# Patient Record
Sex: Female | Born: 1970
Health system: Southern US, Community
[De-identification: ages and names within clinical notes are randomized; demographics above are authoritative.]

## PROBLEM LIST (undated history)

## (undated) DIAGNOSIS — I10 Essential (primary) hypertension: Secondary | ICD-10-CM

## (undated) DIAGNOSIS — Z973 Presence of spectacles and contact lenses: Secondary | ICD-10-CM

## (undated) DIAGNOSIS — F419 Anxiety disorder, unspecified: Secondary | ICD-10-CM

## (undated) DIAGNOSIS — K219 Gastro-esophageal reflux disease without esophagitis: Secondary | ICD-10-CM

## (undated) DIAGNOSIS — N3010 Interstitial cystitis (chronic) without hematuria: Secondary | ICD-10-CM

## (undated) DIAGNOSIS — M1990 Unspecified osteoarthritis, unspecified site: Secondary | ICD-10-CM

## (undated) DIAGNOSIS — F329 Major depressive disorder, single episode, unspecified: Secondary | ICD-10-CM

## (undated) DIAGNOSIS — M51369 Other intervertebral disc degeneration, lumbar region without mention of lumbar back pain or lower extremity pain: Secondary | ICD-10-CM

## (undated) DIAGNOSIS — M5126 Other intervertebral disc displacement, lumbar region: Secondary | ICD-10-CM

## (undated) DIAGNOSIS — Z86718 Personal history of other venous thrombosis and embolism: Secondary | ICD-10-CM

## (undated) DIAGNOSIS — F32A Depression, unspecified: Secondary | ICD-10-CM

## (undated) DIAGNOSIS — M5136 Other intervertebral disc degeneration, lumbar region: Secondary | ICD-10-CM

## (undated) DIAGNOSIS — R3989 Other symptoms and signs involving the genitourinary system: Secondary | ICD-10-CM

## (undated) HISTORY — DX: Essential (primary) hypertension: I10

## (undated) HISTORY — DX: Depression, unspecified: F32.A

## (undated) HISTORY — PX: LUMBAR EPIDURAL INJECTION: SHX1980

## (undated) HISTORY — DX: Unspecified osteoarthritis, unspecified site: M19.90

## (undated) HISTORY — DX: Major depressive disorder, single episode, unspecified: F32.9

## (undated) HISTORY — DX: Interstitial cystitis (chronic) without hematuria: N30.10

## (undated) HISTORY — PX: CARPAL TUNNEL RELEASE: SHX101

## (undated) HISTORY — PX: OTHER SURGICAL HISTORY: SHX169

## (undated) HISTORY — DX: Anxiety disorder, unspecified: F41.9

---

## 1997-11-26 ENCOUNTER — Other Ambulatory Visit: Admission: RE | Admit: 1997-11-26 | Discharge: 1997-11-26 | Payer: Self-pay | Admitting: Obstetrics and Gynecology

## 1997-12-23 ENCOUNTER — Encounter: Admission: RE | Admit: 1997-12-23 | Discharge: 1998-03-23 | Payer: Self-pay | Admitting: Gynecology

## 1998-04-27 ENCOUNTER — Encounter: Admission: RE | Admit: 1998-04-27 | Discharge: 1998-07-26 | Payer: Self-pay | Admitting: Gynecology

## 1998-05-07 ENCOUNTER — Encounter: Payer: Self-pay | Admitting: Emergency Medicine

## 1998-05-08 ENCOUNTER — Inpatient Hospital Stay (HOSPITAL_COMMUNITY): Admission: AD | Admit: 1998-05-08 | Discharge: 1998-05-09 | Payer: Self-pay | Admitting: Obstetrics and Gynecology

## 1998-05-08 HISTORY — PX: LAPAROSCOPIC CHOLECYSTECTOMY: SUR755

## 1998-06-08 HISTORY — PX: TUBAL LIGATION: SHX77

## 1998-06-19 ENCOUNTER — Inpatient Hospital Stay (HOSPITAL_COMMUNITY): Admission: AD | Admit: 1998-06-19 | Discharge: 1998-06-21 | Payer: Self-pay | Admitting: *Deleted

## 1998-07-29 ENCOUNTER — Other Ambulatory Visit: Admission: RE | Admit: 1998-07-29 | Discharge: 1998-07-29 | Payer: Self-pay | Admitting: Obstetrics and Gynecology

## 1998-11-30 ENCOUNTER — Ambulatory Visit (HOSPITAL_COMMUNITY): Admission: RE | Admit: 1998-11-30 | Discharge: 1998-12-01 | Payer: Self-pay | Admitting: General Surgery

## 1998-11-30 ENCOUNTER — Encounter (HOSPITAL_BASED_OUTPATIENT_CLINIC_OR_DEPARTMENT_OTHER): Payer: Self-pay | Admitting: General Surgery

## 2000-01-02 ENCOUNTER — Emergency Department (HOSPITAL_COMMUNITY): Admission: EM | Admit: 2000-01-02 | Discharge: 2000-01-02 | Payer: Self-pay | Admitting: Emergency Medicine

## 2000-01-05 ENCOUNTER — Emergency Department (HOSPITAL_COMMUNITY): Admission: EM | Admit: 2000-01-05 | Discharge: 2000-01-05 | Payer: Self-pay | Admitting: Emergency Medicine

## 2002-07-28 ENCOUNTER — Encounter: Payer: Self-pay | Admitting: *Deleted

## 2002-07-28 ENCOUNTER — Emergency Department (HOSPITAL_COMMUNITY): Admission: EM | Admit: 2002-07-28 | Discharge: 2002-07-28 | Payer: Self-pay | Admitting: *Deleted

## 2003-06-25 ENCOUNTER — Encounter: Admission: RE | Admit: 2003-06-25 | Discharge: 2003-06-25 | Payer: Self-pay | Admitting: Emergency Medicine

## 2005-03-19 ENCOUNTER — Emergency Department (HOSPITAL_COMMUNITY): Admission: EM | Admit: 2005-03-19 | Discharge: 2005-03-19 | Payer: Self-pay | Admitting: Emergency Medicine

## 2007-05-06 ENCOUNTER — Ambulatory Visit (HOSPITAL_COMMUNITY): Admission: RE | Admit: 2007-05-06 | Discharge: 2007-05-06 | Payer: Self-pay | Admitting: Emergency Medicine

## 2009-11-16 ENCOUNTER — Ambulatory Visit: Payer: Self-pay | Admitting: Gynecology

## 2009-11-16 ENCOUNTER — Other Ambulatory Visit: Admission: RE | Admit: 2009-11-16 | Discharge: 2009-11-16 | Payer: Self-pay | Admitting: Gynecology

## 2009-11-26 ENCOUNTER — Ambulatory Visit: Payer: Self-pay | Admitting: Gynecology

## 2009-12-09 ENCOUNTER — Ambulatory Visit: Payer: Self-pay | Admitting: Gynecology

## 2009-12-20 ENCOUNTER — Ambulatory Visit (HOSPITAL_BASED_OUTPATIENT_CLINIC_OR_DEPARTMENT_OTHER): Admission: RE | Admit: 2009-12-20 | Discharge: 2009-12-20 | Payer: Self-pay | Admitting: Gynecology

## 2009-12-20 ENCOUNTER — Ambulatory Visit: Payer: Self-pay | Admitting: Gynecology

## 2009-12-20 HISTORY — PX: OTHER SURGICAL HISTORY: SHX169

## 2010-01-03 ENCOUNTER — Ambulatory Visit: Payer: Self-pay | Admitting: Gynecology

## 2010-01-11 ENCOUNTER — Ambulatory Visit (HOSPITAL_BASED_OUTPATIENT_CLINIC_OR_DEPARTMENT_OTHER): Admission: RE | Admit: 2010-01-11 | Discharge: 2010-01-11 | Payer: Self-pay | Admitting: Urology

## 2010-01-28 ENCOUNTER — Inpatient Hospital Stay (HOSPITAL_COMMUNITY): Admission: EM | Admit: 2010-01-28 | Discharge: 2010-01-30 | Payer: Self-pay | Admitting: Emergency Medicine

## 2010-01-28 ENCOUNTER — Encounter: Payer: Self-pay | Admitting: Emergency Medicine

## 2010-04-12 ENCOUNTER — Ambulatory Visit: Payer: Self-pay | Admitting: Gynecology

## 2010-04-18 ENCOUNTER — Ambulatory Visit: Payer: Self-pay | Admitting: Gynecology

## 2010-04-18 ENCOUNTER — Ambulatory Visit
Admission: RE | Admit: 2010-04-18 | Discharge: 2010-04-19 | Payer: Self-pay | Source: Home / Self Care | Attending: Gynecology | Admitting: Gynecology

## 2010-04-18 HISTORY — PX: VAGINAL HYSTERECTOMY: SUR661

## 2010-04-28 ENCOUNTER — Ambulatory Visit: Payer: Self-pay | Admitting: Gynecology

## 2010-05-12 ENCOUNTER — Ambulatory Visit
Admission: RE | Admit: 2010-05-12 | Discharge: 2010-05-12 | Payer: Self-pay | Source: Home / Self Care | Attending: Gynecology | Admitting: Gynecology

## 2010-05-23 ENCOUNTER — Emergency Department (HOSPITAL_COMMUNITY)
Admission: EM | Admit: 2010-05-23 | Discharge: 2010-05-23 | Payer: Self-pay | Source: Home / Self Care | Admitting: Emergency Medicine

## 2010-05-25 LAB — DIFFERENTIAL
Basophils Absolute: 0 10*3/uL (ref 0.0–0.1)
Basophils Relative: 0 % (ref 0–1)
Eosinophils Absolute: 0.1 10*3/uL (ref 0.0–0.7)
Eosinophils Relative: 1 % (ref 0–5)
Lymphocytes Relative: 28 % (ref 12–46)
Lymphs Abs: 3.7 10*3/uL (ref 0.7–4.0)
Monocytes Absolute: 1.1 10*3/uL — ABNORMAL HIGH (ref 0.1–1.0)
Monocytes Relative: 8 % (ref 3–12)
Neutro Abs: 8.5 10*3/uL — ABNORMAL HIGH (ref 1.7–7.7)
Neutrophils Relative %: 63 % (ref 43–77)

## 2010-05-25 LAB — CBC
HCT: 48.7 % — ABNORMAL HIGH (ref 36.0–46.0)
Hemoglobin: 16.5 g/dL — ABNORMAL HIGH (ref 12.0–15.0)
MCH: 31.2 pg (ref 26.0–34.0)
MCHC: 33.9 g/dL (ref 30.0–36.0)
MCV: 92.1 fL (ref 78.0–100.0)
Platelets: 297 10*3/uL (ref 150–400)
RBC: 5.29 MIL/uL — ABNORMAL HIGH (ref 3.87–5.11)
RDW: 13.7 % (ref 11.5–15.5)
WBC: 13.4 10*3/uL — ABNORMAL HIGH (ref 4.0–10.5)

## 2010-05-25 LAB — BASIC METABOLIC PANEL
BUN: 17 mg/dL (ref 6–23)
CO2: 29 mEq/L (ref 19–32)
Calcium: 10.5 mg/dL (ref 8.4–10.5)
Chloride: 98 mEq/L (ref 96–112)
Creatinine, Ser: 0.77 mg/dL (ref 0.4–1.2)
GFR calc Af Amer: >60 mL/min (ref >60)
GFR calc non Af Amer: >60 mL/min (ref >60)
Glucose, Bld: 97 mg/dL (ref 70–99)
Potassium: 4.2 mEq/L (ref 3.5–5.1)
Sodium: 135 mEq/L (ref 135–145)

## 2010-05-25 LAB — RAPID URINE DRUG SCREEN, HOSP PERFORMED
Amphetamines: NOT DETECTED
Barbiturates: NOT DETECTED
Benzodiazepines: NOT DETECTED
Cocaine: NOT DETECTED
Opiates: POSITIVE — AB
Tetrahydrocannabinol: NOT DETECTED

## 2010-05-25 LAB — TRICYCLICS SCREEN, URINE: TCA Scrn: POSITIVE — AB

## 2010-05-25 LAB — ETHANOL: Alcohol, Ethyl (B): <5 mg/dL (ref 0–10)

## 2010-05-26 ENCOUNTER — Ambulatory Visit
Admission: RE | Admit: 2010-05-26 | Discharge: 2010-05-26 | Payer: Self-pay | Source: Home / Self Care | Attending: Gynecology | Admitting: Gynecology

## 2010-06-08 ENCOUNTER — Other Ambulatory Visit: Payer: Self-pay | Admitting: Gastroenterology

## 2010-06-15 ENCOUNTER — Ambulatory Visit
Admission: RE | Admit: 2010-06-15 | Discharge: 2010-06-15 | Disposition: A | Discharge status: Home / Self Care | Payer: BC Managed Care – PPO | Source: Ambulatory Visit | Attending: Gastroenterology | Admitting: Gastroenterology

## 2010-06-30 ENCOUNTER — Ambulatory Visit
Admission: RE | Admit: 2010-06-30 | Discharge: 2010-06-30 | Disposition: A | Discharge status: Home / Self Care | Payer: BC Managed Care – PPO | Source: Ambulatory Visit | Attending: Gastroenterology | Admitting: Gastroenterology

## 2010-07-18 LAB — DIFFERENTIAL
Basophils Absolute: 0 10*3/uL (ref 0.0–0.1)
Basophils Relative: 0 % (ref 0–1)
Eosinophils Absolute: 0.1 10*3/uL (ref 0.0–0.7)
Eosinophils Relative: 0 % (ref 0–5)
Lymphocytes Relative: 14 % (ref 12–46)
Lymphs Abs: 1.5 10*3/uL (ref 0.7–4.0)
Monocytes Absolute: 0.9 10*3/uL (ref 0.1–1.0)
Monocytes Relative: 8 % (ref 3–12)
Neutro Abs: 8.9 10*3/uL — ABNORMAL HIGH (ref 1.7–7.7)
Neutrophils Relative %: 78 % — ABNORMAL HIGH (ref 43–77)

## 2010-07-18 LAB — CBC
HCT: 36 % (ref 36.0–46.0)
Hemoglobin: 11.8 g/dL — ABNORMAL LOW (ref 12.0–15.0)
MCH: 31.1 pg (ref 26.0–34.0)
MCHC: 32.8 g/dL (ref 30.0–36.0)
MCV: 95 fL (ref 78.0–100.0)
Platelets: 152 10*3/uL (ref 150–400)
RBC: 3.79 MIL/uL — ABNORMAL LOW (ref 3.87–5.11)
RDW: 13.4 % (ref 11.5–15.5)
WBC: 11.3 10*3/uL — ABNORMAL HIGH (ref 4.0–10.5)

## 2010-07-21 LAB — DIFFERENTIAL
Basophils Absolute: 0 10*3/uL (ref 0.0–0.1)
Basophils Absolute: 0 10*3/uL (ref 0.0–0.1)
Basophils Relative: 0 % (ref 0–1)
Basophils Relative: 0 % (ref 0–1)
Eosinophils Absolute: 0.1 10*3/uL (ref 0.0–0.7)
Eosinophils Relative: 1 % (ref 0–5)
Lymphocytes Relative: 23 % (ref 12–46)
Lymphs Abs: 2 10*3/uL (ref 0.7–4.0)
Monocytes Absolute: 0.5 10*3/uL (ref 0.1–1.0)
Monocytes Absolute: 0.4 10*3/uL (ref 0.1–1.0)
Monocytes Relative: 6 % (ref 3–12)
Neutro Abs: 6.1 10*3/uL (ref 1.7–7.7)
Neutro Abs: 4.2 10*3/uL (ref 1.7–7.7)
Neutrophils Relative %: 70 % (ref 43–77)

## 2010-07-21 LAB — BASIC METABOLIC PANEL
BUN: 15 mg/dL (ref 6–23)
BUN: 13 mg/dL (ref 6–23)
BUN: 15 mg/dL (ref 6–23)
CO2: 26 mEq/L (ref 19–32)
CO2: 31 mEq/L (ref 19–32)
Calcium: 9.2 mg/dL (ref 8.4–10.5)
Calcium: 8.6 mg/dL (ref 8.4–10.5)
Calcium: 9.2 mg/dL (ref 8.4–10.5)
Chloride: 101 mEq/L (ref 96–112)
Chloride: 102 mEq/L (ref 96–112)
Creatinine, Ser: 0.67 mg/dL (ref 0.4–1.2)
Creatinine, Ser: 0.75 mg/dL (ref 0.4–1.2)
GFR calc Af Amer: >60 mL/min (ref >60)
GFR calc Af Amer: >60 mL/min (ref >60)
GFR calc non Af Amer: >60 mL/min (ref >60)
GFR calc non Af Amer: >60 mL/min (ref >60)
GFR calc non Af Amer: >60 mL/min (ref >60)
Glucose, Bld: 99 mg/dL (ref 70–99)
Glucose, Bld: 102 mg/dL — ABNORMAL HIGH (ref 70–99)
Glucose, Bld: 101 mg/dL — ABNORMAL HIGH (ref 70–99)
Potassium: 4 mEq/L (ref 3.5–5.1)
Potassium: 3.6 mEq/L (ref 3.5–5.1)
Sodium: 134 mEq/L — ABNORMAL LOW (ref 135–145)
Sodium: 136 mEq/L (ref 135–145)

## 2010-07-21 LAB — CBC
HCT: 41.3 % (ref 36.0–46.0)
HCT: 36.4 % (ref 36.0–46.0)
HCT: 39.5 % (ref 36.0–46.0)
Hemoglobin: 14.2 g/dL (ref 12.0–15.0)
Hemoglobin: 12.3 g/dL (ref 12.0–15.0)
MCH: 31.8 pg (ref 26.0–34.0)
MCH: 30.9 pg (ref 26.0–34.0)
MCHC: 34.4 g/dL (ref 30.0–36.0)
MCHC: 33.8 g/dL (ref 30.0–36.0)
MCHC: 34.3 g/dL (ref 30.0–36.0)
MCV: 92.5 fL (ref 78.0–100.0)
MCV: 91.5 fL (ref 78.0–100.0)
Platelets: 219 10*3/uL (ref 150–400)
Platelets: 198 10*3/uL (ref 150–400)
RBC: 4.47 MIL/uL (ref 3.87–5.11)
RBC: 3.98 MIL/uL (ref 3.87–5.11)
RDW: 14.3 % (ref 11.5–15.5)
RDW: 14.3 % (ref 11.5–15.5)
RDW: 14.7 % (ref 11.5–15.5)
WBC: 8.7 10*3/uL (ref 4.0–10.5)
WBC: 8 10*3/uL (ref 4.0–10.5)

## 2010-07-21 LAB — CK: Total CK: 25 U/L (ref 7–177)

## 2010-07-21 LAB — URINALYSIS, ROUTINE W REFLEX MICROSCOPIC
Bilirubin Urine: NEGATIVE
Glucose, UA: NEGATIVE mg/dL
Hgb urine dipstick: NEGATIVE
Ketones, ur: 15 mg/dL — AB
Nitrite: NEGATIVE
Protein, ur: NEGATIVE mg/dL
Specific Gravity, Urine: 1.022 (ref 1.005–1.030)
Urobilinogen, UA: 0.2 mg/dL (ref 0.0–1.0)
pH: 5.5 (ref 5.0–8.0)

## 2010-07-21 LAB — URINE MICROSCOPIC-ADD ON

## 2010-07-22 LAB — POCT I-STAT, CHEM 8
Calcium, Ion: 1.27 mmol/L (ref 1.12–1.32)
Chloride: 101 mEq/L (ref 96–112)
HCT: 44 % (ref 36.0–46.0)
Hemoglobin: 15 g/dL (ref 12.0–15.0)
TCO2: 30 mmol/L (ref 0–100)

## 2010-11-21 ENCOUNTER — Other Ambulatory Visit: Payer: Self-pay | Admitting: Gynecology

## 2010-11-21 ENCOUNTER — Encounter (INDEPENDENT_AMBULATORY_CARE_PROVIDER_SITE_OTHER): Payer: BC Managed Care – PPO | Admitting: Gynecology

## 2010-11-21 ENCOUNTER — Other Ambulatory Visit (HOSPITAL_COMMUNITY)
Admission: RE | Admit: 2010-11-21 | Discharge: 2010-11-21 | Disposition: A | Discharge status: Home / Self Care | Payer: BC Managed Care – PPO | Source: Ambulatory Visit | Attending: Gynecology | Admitting: Gynecology

## 2010-11-21 DIAGNOSIS — R823 Hemoglobinuria: Secondary | ICD-10-CM

## 2010-11-21 DIAGNOSIS — Z124 Encounter for screening for malignant neoplasm of cervix: Secondary | ICD-10-CM | POA: Insufficient documentation

## 2010-11-21 DIAGNOSIS — Z01419 Encounter for gynecological examination (general) (routine) without abnormal findings: Secondary | ICD-10-CM

## 2012-06-11 IMAGING — RF DG UGI W/ HIGH DENSITY W/KUB
18 of 20 series · 18 of 20 positions shown · non-contrast
Comparison: Upper GI 06/25/2003

CLINICAL DATA: Abdominal pain

UPPER GI SERIES WITH KUB
TECHNIQUE: Routine upper GI series was performed with with  high
density barium.
Fluoroscopy Time: 2.4 minutes

[Series 1: run · 1 of 1 slices shown (1 of 17)]
[im 1/1]
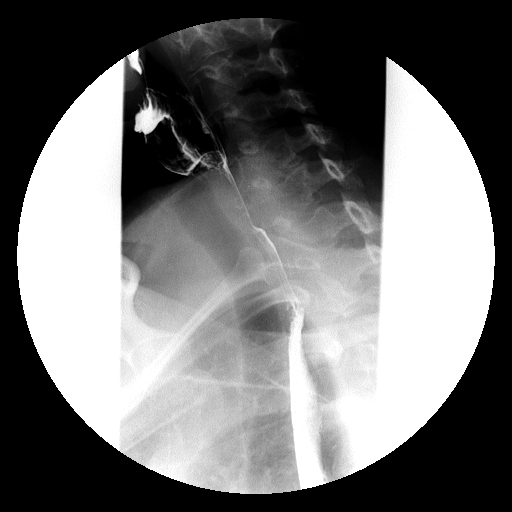

[Series 2: run · 1 of 1 slices shown (2 of 17)]
[im 1/1]
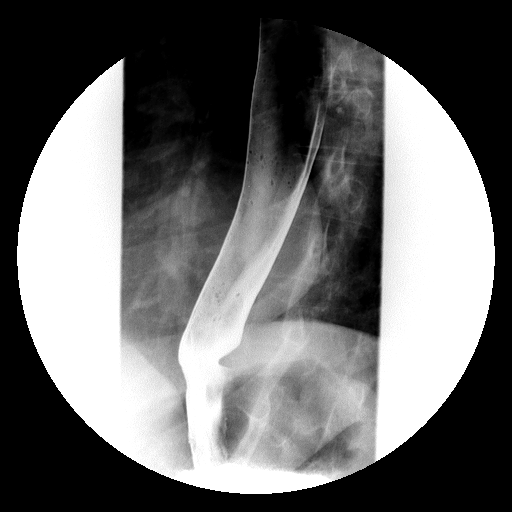

[Series 3: run · 1 of 1 slices shown (3 of 17)]
[im 1/1]
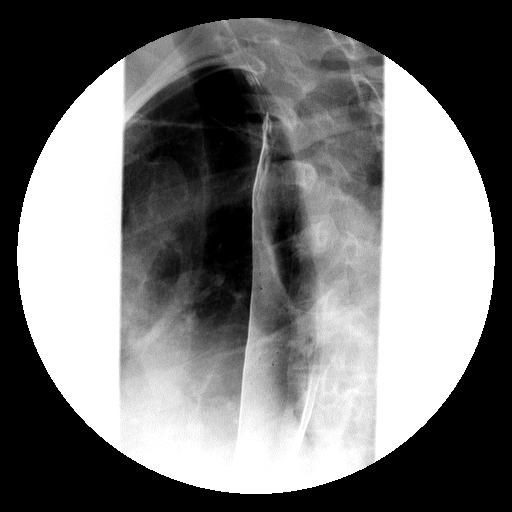

[Series 4: run · 1 of 1 slices shown (4 of 17)]
[im 1/1]
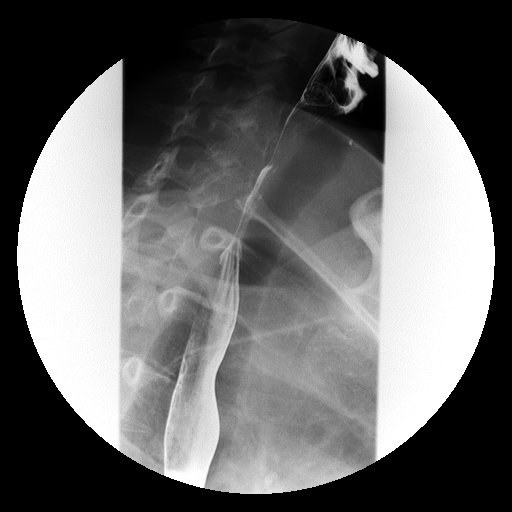

[Series 6: run · 1 of 1 slices shown (5 of 17)]
[im 1/1]
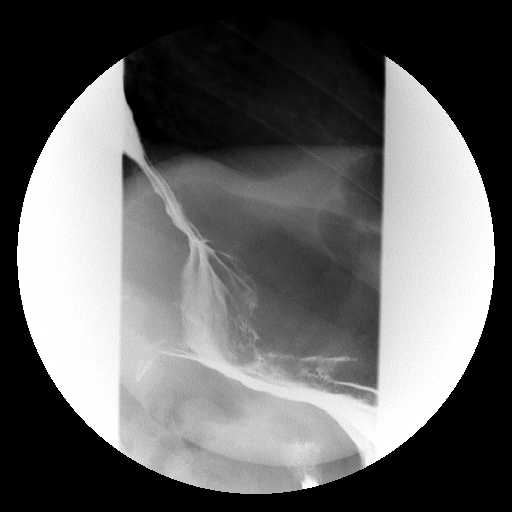

[Series 7: run · 1 of 1 slices shown (6 of 17)]
[im 1/1]
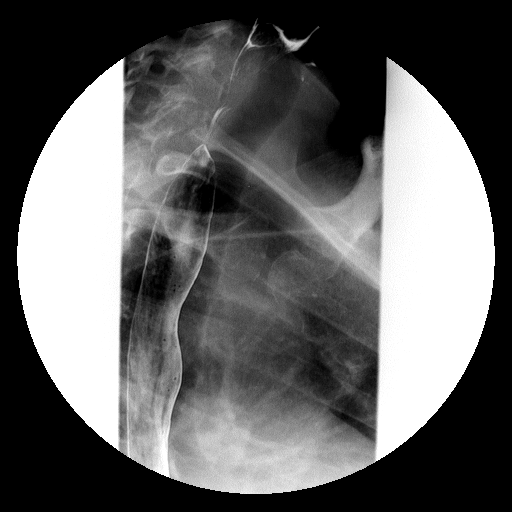

[Series 8: run · 1 of 1 slices shown (7 of 17)]
[im 1/1]
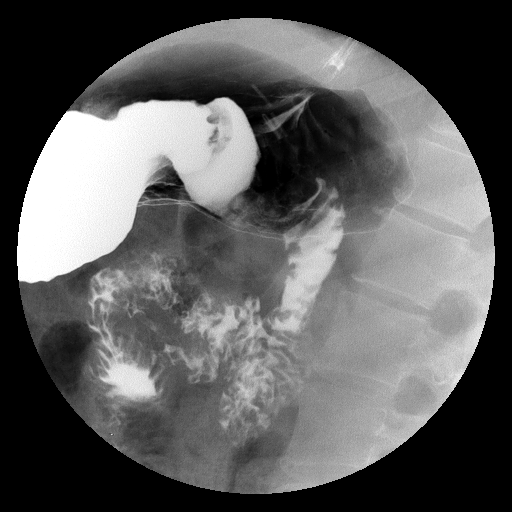

[Series 9: run · 1 of 1 slices shown (8 of 17)]
[im 1/1]
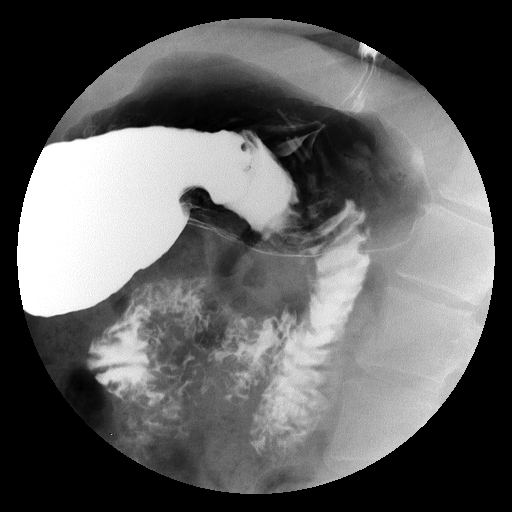

[Series 10: run · 1 of 1 slices shown (9 of 17)]
[im 1/1]
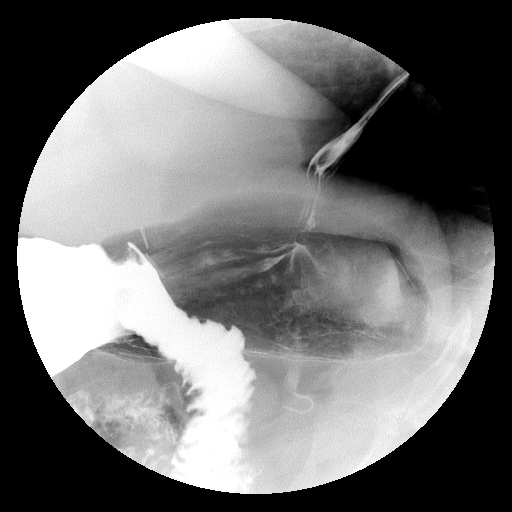

[Series 11: run · 1 of 1 slices shown (10 of 17)]
[im 1/1]
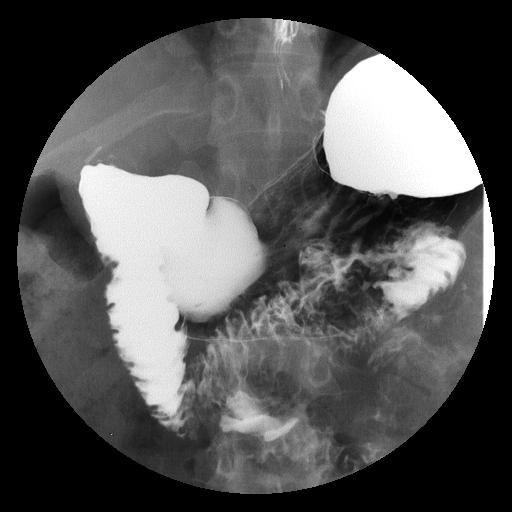

[Series 12: run · 1 of 1 slices shown (11 of 17)]
[im 1/1]
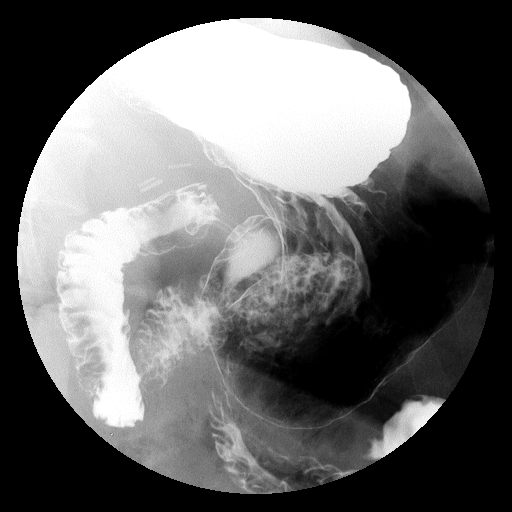

[Series 13: run · 1 of 1 slices shown (12 of 17)]
[im 1/1]
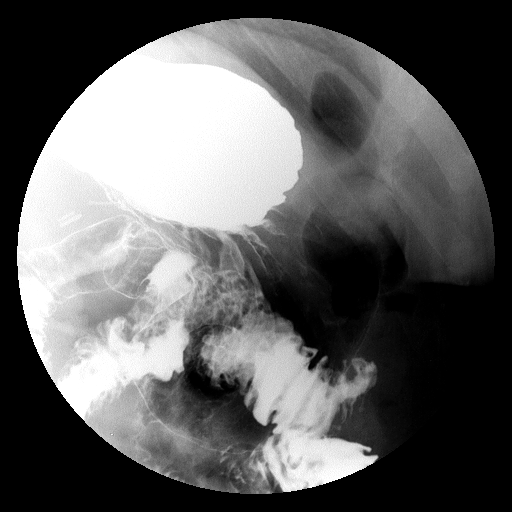

[Series 14: run · 1 of 1 slices shown (13 of 17)]
[im 1/1]
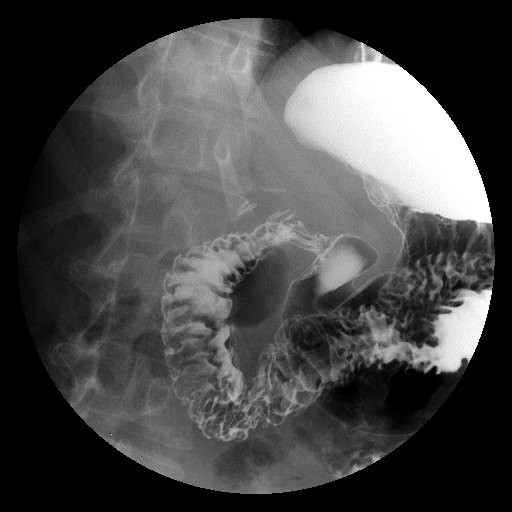

[Series 16: run · 1 of 1 slices shown (14 of 17)]
[im 1/1]
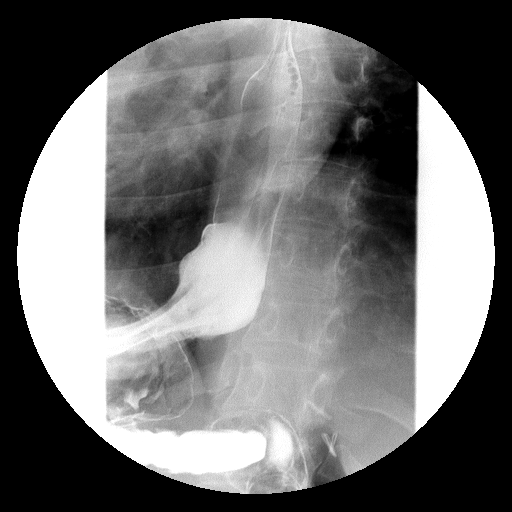

[Series 17: run · 1 of 1 slices shown (15 of 17)]
[im 1/1]
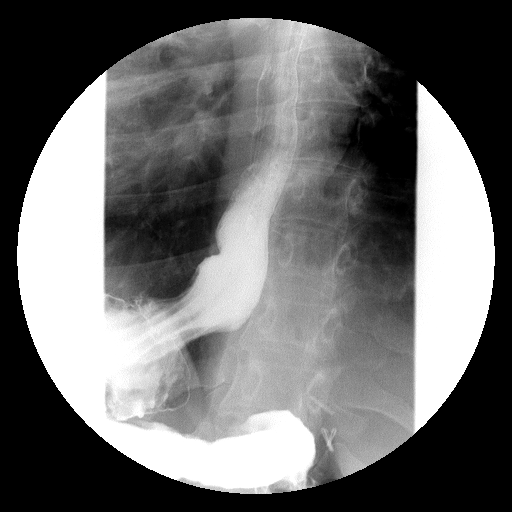

[Series 18: run · 1 of 1 slices shown (16 of 17)]
[im 1/1]
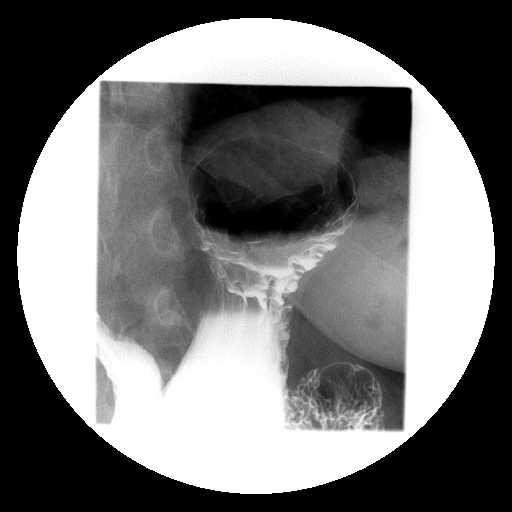

[Series 19: run · 1 of 1 slices shown (17 of 17)]
[im 1/1]
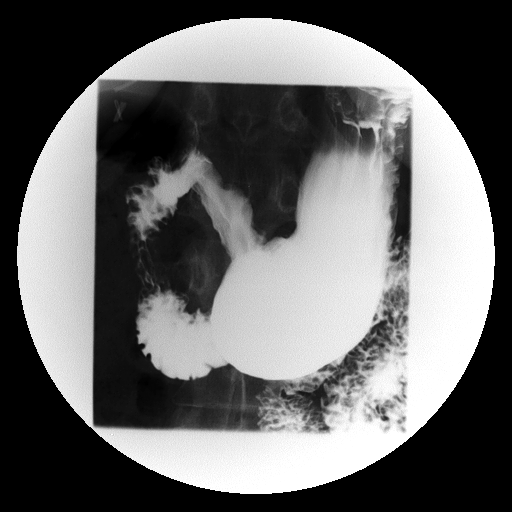

[Series 1001: view not recorded · 0.20mm/px · 1 of 1 slices shown]
[im 1/1]
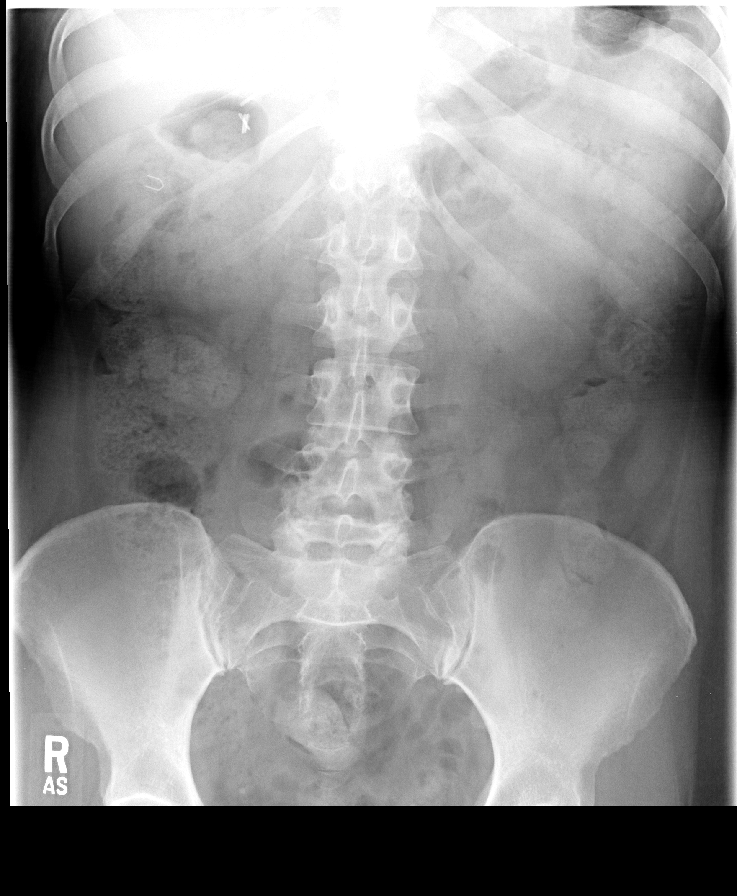

[18 of 20 positions shown; findings below may reference images not displayed]

FINDINGS: Initial KUB demonstrates a moderate volume stool
throughout the colon.  Cholecystectomy clips the upper quadrant.

There is no esophageal mucosal irregularity, stricture, or mass.  A
mild amount of gastroesophageal reflux was demonstrated during the
course of exam.  A 13 mm barium tablet passed GE junction easily.
Normal esophageal motility.

No mucosal irregularity, mass, obstruction of the stomach.  Normal
C-loop of the duodenum.  No evidence of duodenal mucosal
abnormality.
IMPRESSION: 1.  Mild gastroesophageal reflux demonstrated.
2.  No mucosal irregularity stricture or mass within the esophagus,
stomach, or duodenum.

## 2013-06-08 ENCOUNTER — Emergency Department (HOSPITAL_COMMUNITY): Payer: BC Managed Care – PPO

## 2013-06-08 ENCOUNTER — Emergency Department (HOSPITAL_COMMUNITY)
Admission: EM | Admit: 2013-06-08 | Discharge: 2013-06-08 | Disposition: A | Discharge status: Home / Self Care | Payer: BC Managed Care – PPO | Attending: Emergency Medicine | Admitting: Emergency Medicine

## 2013-06-08 ENCOUNTER — Encounter (HOSPITAL_COMMUNITY): Payer: Self-pay | Admitting: Emergency Medicine

## 2013-06-08 DIAGNOSIS — R0789 Other chest pain: Secondary | ICD-10-CM

## 2013-06-08 DIAGNOSIS — K219 Gastro-esophageal reflux disease without esophagitis: Secondary | ICD-10-CM | POA: Insufficient documentation

## 2013-06-08 DIAGNOSIS — Z87891 Personal history of nicotine dependence: Secondary | ICD-10-CM | POA: Insufficient documentation

## 2013-06-08 DIAGNOSIS — I10 Essential (primary) hypertension: Secondary | ICD-10-CM | POA: Insufficient documentation

## 2013-06-08 DIAGNOSIS — Z86718 Personal history of other venous thrombosis and embolism: Secondary | ICD-10-CM | POA: Insufficient documentation

## 2013-06-08 DIAGNOSIS — J45909 Unspecified asthma, uncomplicated: Secondary | ICD-10-CM | POA: Insufficient documentation

## 2013-06-08 DIAGNOSIS — Z880 Allergy status to penicillin: Secondary | ICD-10-CM | POA: Insufficient documentation

## 2013-06-08 DIAGNOSIS — F319 Bipolar disorder, unspecified: Secondary | ICD-10-CM | POA: Insufficient documentation

## 2013-06-08 DIAGNOSIS — N3010 Interstitial cystitis (chronic) without hematuria: Secondary | ICD-10-CM | POA: Insufficient documentation

## 2013-06-08 HISTORY — DX: Gastro-esophageal reflux disease without esophagitis: K21.9

## 2013-06-08 LAB — BASIC METABOLIC PANEL
BUN: 15 mg/dL (ref 6–23)
CALCIUM: 9.4 mg/dL (ref 8.4–10.5)
CO2: 24 meq/L (ref 19–32)
CREATININE: 0.7 mg/dL (ref 0.50–1.10)
Chloride: 98 mEq/L (ref 96–112)
GFR calc Af Amer: >90 mL/min (ref >90)
GLUCOSE: 95 mg/dL (ref 70–99)
Potassium: 4.1 mEq/L (ref 3.7–5.3)
Sodium: 136 mEq/L — ABNORMAL LOW (ref 137–147)

## 2013-06-08 LAB — CBC
HCT: 45.1 % (ref 36.0–46.0)
HEMOGLOBIN: 15.6 g/dL — AB (ref 12.0–15.0)
MCH: 30.6 pg (ref 26.0–34.0)
MCHC: 34.6 g/dL (ref 30.0–36.0)
MCV: 88.6 fL (ref 78.0–100.0)
Platelets: 203 10*3/uL (ref 150–400)
RBC: 5.09 MIL/uL (ref 3.87–5.11)
RDW: 12.7 % (ref 11.5–15.5)
WBC: 9.3 10*3/uL (ref 4.0–10.5)

## 2013-06-08 LAB — POCT I-STAT TROPONIN I: Troponin i, poc: 0 ng/mL (ref 0.00–0.08)

## 2013-06-08 NOTE — ED Notes (Signed)
Pt a+ox4, presents with c/o L ant CP, onset x3 days ago, intermittent, shooting in nature, lasting only few seconds, approx 12 episodes per day. Nonradiating, reproducible with position changes.  Pt denies SOB, dizziness.  +nausea this AM.  PERRLA, neuros grossly intact, skin pwd.  Speaking full/clear sentences.  MAEI, +csm/+pulses.  Pt denies other complaitns.

## 2013-06-08 NOTE — ED Notes (Signed)
She tells me she has had brief, self-limiting episodes of chest pain x 2 days (Friday) which are unrelated to, and unaffected by eating.  She denies fever, nor any other sign of recent illness and is in no distress.  Her husband is with her.

## 2013-06-08 NOTE — ED Provider Notes (Signed)
TIME SEEN: 3:22 PM  CHIEF COMPLAINT: Chest pain  HPI: Patient is a 43 y.o. female with a history of hypertension, asthma, interstitial cystitis, bipolar disorder, prior left lower extremity DVT and T11 no longer on anticoagulation, history of prior tobacco use who presents emergency department with intermittent episodes of left-sided chest pain. She states that since Friday, 2 days ago she has had episodes of sharp chest pain the only last 1-2 seconds and then completely resolved. She denies as any radiation of the pain. She denies any aggravating or alleviating factors. She has no associated shortness of breath, nausea or vomiting, dizziness, diaphoresis. She denies any fevers or cough. No recent lower extremity swelling or pain. She is completely pain-free now.  ROS: See HPI Constitutional: no fever  Eyes: no drainage  ENT: no runny nose   Cardiovascular:   chest pain  Resp: no SOB  GI: no vomiting GU: no dysuria Integumentary: no rash  Allergy: no hives  Musculoskeletal: no leg swelling  Neurological: no slurred speech ROS otherwise negative  PAST MEDICAL HISTORY/PAST SURGICAL HISTORY:  Past Medical History  Diagnosis Date  . Endometriosis   . Hypertension   . Asthma   . Interstitial cystitis   . Bipolar disorder   . Embolism - blood clot 2011    LEFT LEG  . GERD (gastroesophageal reflux disease)     MEDICATIONS:  Prior to Admission medications   Medication Sig Start Date End Date Taking? Authorizing Provider  calcium carbonate (TUMS - DOSED IN MG ELEMENTAL CALCIUM) 500 MG chewable tablet Chew 1 tablet by mouth daily.   Yes Historical Provider, MD  doxycycline (VIBRAMYCIN) 100 MG capsule Take 100 mg by mouth at bedtime.    Yes Historical Provider, MD  pantoprazole (PROTONIX) 40 MG tablet Take 40 mg by mouth daily.   Yes Historical Provider, MD  ramipril (ALTACE) 5 MG tablet Take 5 mg by mouth daily.     Yes Historical Provider, MD    ALLERGIES:  Allergies  Allergen  Reactions  . Penicillins Rash    SOCIAL HISTORY:  History  Substance Use Topics  . Smoking status: Former Games developermoker  . Smokeless tobacco: Never Used  . Alcohol Use: No    FAMILY HISTORY: Family History  Problem Relation Age of Onset  . Diabetes Mother   . Hypertension Father     EXAM: BP 143/96  Pulse 99  Temp(Src) 97.9 F (36.6 C) (Oral)  Resp 16  SpO2 97% CONSTITUTIONAL: Alert and oriented and responds appropriately to questions. Well-appearing; well-nourished HEAD: Normocephalic EYES: Conjunctivae clear, PERRL ENT: normal nose; no rhinorrhea; moist mucous membranes; pharynx without lesions noted NECK: Supple, no meningismus, no LAD  CARD: RRR; S1 and S2 appreciated; no murmurs, no clicks, no rubs, no gallops RESP: Normal chest excursion without splinting or tachypnea; breath sounds clear and equal bilaterally; no wheezes, no rhonchi, no rales, chest wall is nontender to palpation ABD/GI: Normal bowel sounds; non-distended; soft, non-tender, no rebound, no guarding BACK:  The back appears normal and is non-tender to palpation, there is no CVA tenderness EXT: Normal ROM in all joints; non-tender to palpation; no edema; normal capillary refill; no cyanosis    SKIN: Normal color for age and race; warm NEURO: Moves all extremities equally PSYCH: The patient's mood and manner are appropriate. Grooming and personal hygiene are appropriate.  MEDICAL DECISION MAKING: Patient here with very atypical chest pain. Her pain never lasts more than 2 seconds and completely resolves on its. She has risk factors  for cardiac disease and has had a prior DVT but given she is completely asymptomatic currently and her symptoms only last for several seconds and then resolve, I am not concerned for any life-threatening illness at this time. Her labs including her troponin are normal. Chest x-ray is negative. Have discussed with patient and her husband at length that if her symptoms become more  persistent or she has other associated symptoms such as shortness of breath, diaphoresis, to return to the emergency department immediately. Patient has a primary care doctor, Dr. Clovis Riley, for followup. I do not feel she needs any further workup at this time. Patient and husband verbalize understanding and agree with this plan.   EKG Interpretation    Date/Time:  Sunday June 08 2013 13:28:56 EST Ventricular Rate:  98 PR Interval:  132 QRS Duration: 68 QT Interval:  343 QTC Calculation: 438 R Axis:   48 Text Interpretation:  Sinus rhythm Abnormal R-wave progression, early transition Borderline repolarization abnormality Confirmed by WARD  DO, KRISTEN ([id number]) on 06/08/2013 3:02:08 PM              Layla Maw Ward, DO 06/08/13 1526

## 2013-06-08 NOTE — ED Notes (Signed)
She states she is in no pain at present.  She is happy to hear that troponin is ok.

## 2013-06-08 NOTE — Discharge Instructions (Signed)
You were seen in the ED for chest pain.  Your EKG shows no new changes and your labs and chest xray were normal.  Given your chest pain only lasts a few seconds and then completely resolve, I am not concerned for any life threatening illness at this time.  If your chest pain changes however it becomes more persistent, you have a pressure or tightness in her chest that radiates into her arm or jaw, feel short of breath, start to suddenly sweat, feel like you're going to pass out or do passout, please return the emergency department for further evaluation. I recommended you follow up with your primary care physician, Dr. Clovis Riley, this week for further evaluation.   Chest Pain (Nonspecific) It is often hard to give a specific diagnosis for the cause of chest pain. There is always a chance that your pain could be related to something serious, such as a heart attack or a blood clot in the lungs. You need to follow up with your caregiver for further evaluation. CAUSES   Heartburn.  Pneumonia or bronchitis.  Anxiety or stress.  Inflammation around your heart (pericarditis) or lung (pleuritis or pleurisy).  A blood clot in the lung.  A collapsed lung (pneumothorax). It can develop suddenly on its own (spontaneous pneumothorax) or from injury (trauma) to the chest.  Shingles infection (herpes zoster virus). The chest wall is composed of bones, muscles, and cartilage. Any of these can be the source of the pain.  The bones can be bruised by injury.  The muscles or cartilage can be strained by coughing or overwork.  The cartilage can be affected by inflammation and become sore (costochondritis). DIAGNOSIS  Lab tests or other studies, such as X-rays, electrocardiography, stress testing, or cardiac imaging, may be needed to find the cause of your pain.  TREATMENT   Treatment depends on what may be causing your chest pain. Treatment may include:  Acid blockers for heartburn.  Anti-inflammatory  medicine.  Pain medicine for inflammatory conditions.  Antibiotics if an infection is present.  You may be advised to change lifestyle habits. This includes stopping smoking and avoiding alcohol, caffeine, and chocolate.  You may be advised to keep your head raised (elevated) when sleeping. This reduces the chance of acid going backward from your stomach into your esophagus.  Most of the time, nonspecific chest pain will improve within 2 to 3 days with rest and mild pain medicine. HOME CARE INSTRUCTIONS   If antibiotics were prescribed, take your antibiotics as directed. Finish them even if you start to feel better.  For the next few days, avoid physical activities that bring on chest pain. Continue physical activities as directed.  Do not smoke.  Avoid drinking alcohol.  Only take over-the-counter or prescription medicine for pain, discomfort, or fever as directed by your caregiver.  Follow your caregiver's suggestions for further testing if your chest pain does not go away.  Keep any follow-up appointments you made. If you do not go to an appointment, you could develop lasting (chronic) problems with pain. If there is any problem keeping an appointment, you must call to reschedule. SEEK MEDICAL CARE IF:   You think you are having problems from the medicine you are taking. Read your medicine instructions carefully.  Your chest pain does not go away, even after treatment.  You develop a rash with blisters on your chest. SEEK IMMEDIATE MEDICAL CARE IF:   You have increased chest pain or pain that spreads to your arm,  neck, jaw, back, or abdomen.  You develop shortness of breath, an increasing cough, or you are coughing up blood.  You have severe back or abdominal pain, feel nauseous, or vomit.  You develop severe weakness, fainting, or chills.  You have a fever. THIS IS AN EMERGENCY. Do not wait to see if the pain will go away. Get medical help at once. Call your local  emergency services (911 in [place] Do not drive yourself to the hospital. MAKE SURE YOU:   Understand these instructions.  Will watch your condition.  Will get help right away if you are not doing well or get worse. Document Released: 02/01/2005 Document Revised: 07/17/2011 Document Reviewed: 11/28/2007 [facility] Patient Information 2014 [employer], [place].

## 2013-09-22 ENCOUNTER — Other Ambulatory Visit: Payer: Self-pay | Admitting: Gynecology

## 2013-09-22 DIAGNOSIS — Z1231 Encounter for screening mammogram for malignant neoplasm of breast: Secondary | ICD-10-CM

## 2013-09-24 ENCOUNTER — Ambulatory Visit (HOSPITAL_COMMUNITY)
Admission: RE | Admit: 2013-09-24 | Discharge: 2013-09-24 | Disposition: A | Discharge status: Home / Self Care | Payer: BC Managed Care – PPO | Source: Ambulatory Visit | Attending: Gynecology | Admitting: Gynecology

## 2013-09-24 DIAGNOSIS — Z1231 Encounter for screening mammogram for malignant neoplasm of breast: Secondary | ICD-10-CM | POA: Insufficient documentation

## 2013-10-16 ENCOUNTER — Encounter: Payer: Self-pay | Admitting: Women's Health

## 2013-10-16 ENCOUNTER — Ambulatory Visit (INDEPENDENT_AMBULATORY_CARE_PROVIDER_SITE_OTHER): Payer: BC Managed Care – PPO | Admitting: Women's Health

## 2013-10-16 DIAGNOSIS — F17200 Nicotine dependence, unspecified, uncomplicated: Secondary | ICD-10-CM

## 2013-10-16 DIAGNOSIS — R350 Frequency of micturition: Secondary | ICD-10-CM

## 2013-10-16 DIAGNOSIS — IMO0001 Reserved for inherently not codable concepts without codable children: Secondary | ICD-10-CM

## 2013-10-16 DIAGNOSIS — F1021 Alcohol dependence, in remission: Secondary | ICD-10-CM | POA: Insufficient documentation

## 2013-10-16 DIAGNOSIS — N898 Other specified noninflammatory disorders of vagina: Secondary | ICD-10-CM

## 2013-10-16 DIAGNOSIS — F1020 Alcohol dependence, uncomplicated: Secondary | ICD-10-CM

## 2013-10-16 LAB — WET PREP FOR TRICH, YEAST, CLUE
Clue Cells Wet Prep HPF POC: NONE SEEN
Trich, Wet Prep: NONE SEEN

## 2013-10-16 LAB — URINALYSIS W MICROSCOPIC + REFLEX CULTURE
BILIRUBIN URINE: NEGATIVE
CASTS: NONE SEEN
CRYSTALS: NONE SEEN
GLUCOSE, UA: NEGATIVE mg/dL
KETONES UR: NEGATIVE mg/dL
Leukocytes, UA: NEGATIVE
NITRITE: NEGATIVE
PH: 5 (ref 5.0–8.0)
Protein, ur: NEGATIVE mg/dL
Urobilinogen, UA: 0.2 mg/dL (ref 0.0–1.0)

## 2013-10-16 MED ORDER — FLUCONAZOLE 150 MG PO TABS: 150.0000 mg | ORAL_TABLET | Freq: Once | ORAL | Status: DC

## 2013-10-16 NOTE — Progress Notes (Signed)
Patient ID: Victoria Camacho, female   DOB: [birth year], 43 y.o.   MRN: [id number] Presents with complaint of increased urinary frequency, vaginal irritation with itching. Reports minimal occasional low abdominal cramping, denies fever. Reports struggling with alcoholism  attending AA meetings daily,  relapse in April. Hysterectomy.  Exam: Tearful. External genitalia erythematous at introitus, speculum exam moderate amount of a white discharge noted, wet prep positive for moderate  yeast. Bimanual no adnexal fullness or tenderness. UA: Small blood, 0 - 2 WBCs, 3-6 rbc's, few bacteria, yeast.  Yeast vaginitis  Plan: Urine culture pending. Diflucan 1:50 PM today and repeat in 3 days if needed. Instructed to call if no relief of symptoms. Schedule annual exam appointment.

## 2013-10-16 NOTE — Patient Instructions (Signed)
Monilial Vaginitis  Vaginitis in a soreness, swelling and redness (inflammation) of the vagina and vulva. Monilial vaginitis is not a sexually transmitted infection.  CAUSES   Yeast vaginitis is caused by yeast (candida) that is normally found in your vagina. With a yeast infection, the candida has overgrown in number to a point that upsets the chemical balance.  SYMPTOMS   · White, thick vaginal discharge.  · Swelling, itching, redness and irritation of the vagina and possibly the lips of the vagina (vulva).  · Burning or painful urination.  · Painful intercourse.  DIAGNOSIS   Things that may contribute to monilial vaginitis are:  · Postmenopausal and virginal states.  · Pregnancy.  · Infections.  · Being tired, sick or stressed, especially if you had monilial vaginitis in the past.  · Diabetes. Good control will help lower the chance.  · Birth control pills.  · Tight fitting garments.  · Using bubble bath, feminine sprays, douches or deodorant tampons.  · Taking certain medications that kill germs (antibiotics).  · Sporadic recurrence can occur if you become ill.  TREATMENT   Your caregiver will give you medication.  · There are several kinds of anti monilial vaginal creams and suppositories specific for monilial vaginitis. For recurrent yeast infections, use a suppository or cream in the vagina 2 times a week, or as directed.  · Anti-monilial or steroid cream for the itching or irritation of the vulva may also be used. Get your caregiver's permission.  · Painting the vagina with methylene blue solution may help if the monilial cream does not work.  · Eating yogurt may help prevent monilial vaginitis.  HOME CARE INSTRUCTIONS   · Finish all medication as prescribed.  · Do not have sex until treatment is completed or after your caregiver tells you it is okay.  · Take warm sitz baths.  · Do not douche.  · Do not use tampons, especially scented ones.  · Wear cotton underwear.  · Avoid tight pants and panty  hose.  · Tell your sexual partner that you have a yeast infection. They should go to their caregiver if they have symptoms such as mild rash or itching.  · Your sexual partner should be treated as well if your infection is difficult to eliminate.  · Practice safer sex. Use condoms.  · Some vaginal medications cause latex condoms to fail. Vaginal medications that harm condoms are:  · Cleocin cream.  · Butoconazole (Femstat®).  · Terconazole (Terazol®) vaginal suppository.  · Miconazole (Monistat®) (may be purchased over the counter).  SEEK MEDICAL CARE IF:   · You have a temperature by mouth above 102° F (38.9° C).  · The infection is getting worse after 2 days of treatment.  · The infection is not getting better after 3 days of treatment.  · You develop blisters in or around your vagina.  · You develop vaginal bleeding, and it is not your menstrual period.  · You have pain when you urinate.  · You develop intestinal problems.  · You have pain with sexual intercourse.  Document Released: 02/01/2005 Document Revised: 07/17/2011 Document Reviewed: 10/16/2008  Exit[employer]® Patient Information ©2014 [employer].

## 2013-10-17 ENCOUNTER — Telehealth: Payer: Self-pay | Admitting: *Deleted

## 2013-10-17 LAB — URINE CULTURE: Colony Count: 70000

## 2013-10-17 NOTE — Telephone Encounter (Signed)
Pt was seen yesterday dx yeast infection given Dilfucan 150 x 1 dose. Pt said she still having lower back pain on left side, urine culture still pending. Any recommendations? Please advise

## 2013-10-17 NOTE — Telephone Encounter (Signed)
Telephone call , states is taking tramadol every 6 hours which has not helped the pain much, will try heating pad, rest, off work today. Declines any other stronger medication. Reviewed urine culture pending.

## 2013-11-05 ENCOUNTER — Ambulatory Visit (INDEPENDENT_AMBULATORY_CARE_PROVIDER_SITE_OTHER): Payer: BC Managed Care – PPO | Admitting: Gynecology

## 2013-11-05 ENCOUNTER — Other Ambulatory Visit (HOSPITAL_COMMUNITY)
Admission: RE | Admit: 2013-11-05 | Discharge: 2013-11-05 | Disposition: A | Discharge status: Home / Self Care | Payer: BC Managed Care – PPO | Source: Ambulatory Visit | Attending: Gynecology | Admitting: Gynecology

## 2013-11-05 ENCOUNTER — Encounter: Payer: Self-pay | Admitting: Gynecology

## 2013-11-05 VITALS — BP 118/74 | Ht 60.0 in | Wt 155.0 lb

## 2013-11-05 DIAGNOSIS — N951 Menopausal and female climacteric states: Secondary | ICD-10-CM

## 2013-11-05 DIAGNOSIS — Z01419 Encounter for gynecological examination (general) (routine) without abnormal findings: Secondary | ICD-10-CM | POA: Insufficient documentation

## 2013-11-05 LAB — CBC WITH DIFFERENTIAL/PLATELET
BASOS PCT: 0 % (ref 0–1)
Basophils Absolute: 0 10*3/uL (ref 0.0–0.1)
Eosinophils Absolute: 0.2 10*3/uL (ref 0.0–0.7)
Eosinophils Relative: 2 % (ref 0–5)
HEMATOCRIT: 40.8 % (ref 36.0–46.0)
Hemoglobin: 13.8 g/dL (ref 12.0–15.0)
Lymphocytes Relative: 29 % (ref 12–46)
Lymphs Abs: 2.3 10*3/uL (ref 0.7–4.0)
MCH: 30.1 pg (ref 26.0–34.0)
MCHC: 33.8 g/dL (ref 30.0–36.0)
MCV: 88.9 fL (ref 78.0–100.0)
MONO ABS: 0.5 10*3/uL (ref 0.1–1.0)
MONOS PCT: 6 % (ref 3–12)
Neutro Abs: 5.1 10*3/uL (ref 1.7–7.7)
Neutrophils Relative %: 63 % (ref 43–77)
Platelets: 206 10*3/uL (ref 150–400)
RBC: 4.59 MIL/uL (ref 3.87–5.11)
RDW: 13.7 % (ref 11.5–15.5)
WBC: 8.1 10*3/uL (ref 4.0–10.5)

## 2013-11-05 NOTE — Progress Notes (Signed)
Victoria MandesSherry M Camacho Oct 30, 1970 [id number]        43 y.o.  G3P3 for annual exam. Has not been in for several years. Several issues noted below.  Past medical history,surgical history, problem list, medications, allergies, family history and social history were all reviewed and documented as reviewed in the EPIC chart.  ROS:  12 system ROS performed with pertinent positives and negatives included in the history, assessment and plan.   Additional significant findings :  None   Exam: [facility] person[facility]istant Filed Vitals:   11/05/13 1518  BP: 118/74  Height: 5' (1.524 m)  Weight: 155 lb (70.308 kg)   General appearance:  Normal affect, orientation and appearance. Skin: Grossly normal HEENT: Without gross lesions.  No cervical or supraclavicular adenopathy. Thyroid normal.  Lungs:  Clear without wheezing, rales or rhonchi Cardiac: RR, without RMG Abdominal:  Soft, nontender, without masses, guarding, rebound, organomegaly or hernia Breasts:  Examined lying and sitting without masses, retractions, discharge or axillary adenopathy. Pelvic:  Ext/BUS/vagina normal  Adnexa  Without masses or tenderness    Anus and perineum  Normal   Rectovaginal  Normal sphincter tone without palpated masses or tenderness.    Assessment/Plan:  43 y.o. G3P3 female for annual exam.   1. Status post TVH 2011 for endometriosis/dysmenorrhea/pelvic pain. Notes some hot flashes at night. Otherwise doing well without issues with vaginal dryness or dyspareunia. We'll check FSH TSH. Assuming normal didn't plan to monitor her now. If abnormal then we'll discuss possible options. 2. Pap smear 2012. Pap of vaginal cuff today. No history of abnormal Pap smears previously. Office cystoscopy altogether for less frequent screening intervals reviewed per current screening guidelines. Will review on an annual basis. 3. Mammography 09/2013. Continue with annual mammography. SBE monthly reviewed. 4. Health maintenance. Baseline CBC  comprehensive metabolic panel lipid profile urinalysis ordered along with her FSH TSH. Followup for lab results otherwise annually.   Note: This document was prepared with digital dictation and possible smart phrase technology. Any transcriptional errors that result from this process are unintentional.   Dara LordsFONTAINE,TIMOTHY P MD, 3:54 PM 11/05/2013

## 2013-11-05 NOTE — Addendum Note (Signed)
Addended by: Dayna BarkerGARDNER, KIMBERLY K on: 11/05/2013 03:58 PM   Modules accepted: Orders

## 2013-11-05 NOTE — Patient Instructions (Signed)
Followup in one year for annual exam.  You may obtain a copy of any labs that were done today by logging onto MyChart as outlined in the instructions provided with your AVS (after visit summary). The office will not call with normal lab results but certainly if there are any significant abnormalities then we will contact you.   Health Maintenance, Female A healthy lifestyle and preventative care can promote health and wellness.  Maintain regular health, dental, and eye exams.  Eat a healthy diet. Foods like vegetables, fruits, whole grains, low-fat dairy products, and lean protein foods contain the nutrients you need without too many calories. Decrease your intake of foods high in solid fats, added sugars, and salt. Get information about a proper diet from your caregiver, if necessary.  Regular physical exercise is one of the most important things you can do for your health. Most adults should get at least 150 minutes of moderate-intensity exercise (any activity that increases your heart rate and causes you to sweat) each week. In addition, most adults need muscle-strengthening exercises on 2 or more days a week.   Maintain a healthy weight. The body mass index (BMI) is a screening tool to identify possible weight problems. It provides an estimate of body fat based on height and weight. Your caregiver can help determine your BMI, and can help you achieve or maintain a healthy weight. For adults 20 years and older:  A BMI below 18.5 is considered underweight.  A BMI of 18.5 to 24.9 is normal.  A BMI of 25 to 29.9 is considered overweight.  A BMI of 30 and above is considered obese.  Maintain normal blood lipids and cholesterol by exercising and minimizing your intake of saturated fat. Eat a balanced diet with plenty of fruits and vegetables. Blood tests for lipids and cholesterol should begin at age 78 and be repeated every 5 years. If your lipid or cholesterol levels are high, you are over  50, or you are a high risk for heart disease, you may need your cholesterol levels checked more frequently.Ongoing high lipid and cholesterol levels should be treated with medicines if diet and exercise are not effective.  If you smoke, find out from your caregiver how to quit. If you do not use tobacco, do not start.  Lung cancer screening is recommended for adults aged 15 80 years who are at high risk for developing lung cancer because of a history of smoking. Yearly low-dose computed tomography (CT) is recommended for people who have at least a 30-pack-year history of smoking and are a current smoker or have quit within the past 15 years. A pack year of smoking is smoking an average of 1 pack of cigarettes a day for 1 year (for example: 1 pack a day for 30 years or 2 packs a day for 15 years). Yearly screening should continue until the smoker has stopped smoking for at least 15 years. Yearly screening should also be stopped for people who develop a health problem that would prevent them from having lung cancer treatment.  If you are pregnant, do not drink alcohol. If you are breastfeeding, be very cautious about drinking alcohol. If you are not pregnant and choose to drink alcohol, do not exceed 1 drink per day. One drink is considered to be 12 ounces (355 mL) of beer, 5 ounces (148 mL) of wine, or 1.5 ounces (44 mL) of liquor.  Avoid use of street drugs. Do not share needles with anyone. Ask for help  if you need support or instructions about stopping the use of drugs.  High blood pressure causes heart disease and increases the risk of stroke. Blood pressure should be checked at least every 1 to 2 years. Ongoing high blood pressure should be treated with medicines, if weight loss and exercise are not effective.  If you are 55 to 43 years old, ask your caregiver if you should take aspirin to prevent strokes.  Diabetes screening involves taking a blood sample to check your fasting blood sugar level.  This should be done once every 3 years, after age 45, if you are within normal weight and without risk factors for diabetes. Testing should be considered at a younger age or be carried out more frequently if you are overweight and have at least 1 risk factor for diabetes.  Breast cancer screening is essential preventative care for women. You should practice "breast self-awareness." This means understanding the normal appearance and feel of your breasts and may include breast self-examination. Any changes detected, no matter how small, should be reported to a caregiver. Women in their 20s and 30s should have a clinical breast exam (CBE) by a caregiver as part of a regular health exam every 1 to 3 years. After age 40, women should have a CBE every year. Starting at age 40, women should consider having a mammogram (breast X-ray) every year. Women who have a family history of breast cancer should talk to their caregiver about genetic screening. Women at a high risk of breast cancer should talk to their caregiver about having an MRI and a mammogram every year.  Breast cancer gene (BRCA)-related cancer risk assessment is recommended for women who have family members with BRCA-related cancers. BRCA-related cancers include breast, ovarian, tubal, and peritoneal cancers. Having family members with these cancers may be associated with an increased risk for harmful changes (mutations) in the breast cancer genes BRCA1 and BRCA2. Results of the assessment will determine the need for genetic counseling and BRCA1 and BRCA2 testing.  The Pap test is a screening test for cervical cancer. Women should have a Pap test starting at age 21. Between ages 21 and 29, Pap tests should be repeated every 2 years. Beginning at age 30, you should have a Pap test every 3 years as long as the past 3 Pap tests have been normal. If you had a hysterectomy for a problem that was not cancer or a condition that could lead to cancer, then you no  longer need Pap tests. If you are between ages 65 and 70, and you have had normal Pap tests going back 10 years, you no longer need Pap tests. If you have had past treatment for cervical cancer or a condition that could lead to cancer, you need Pap tests and screening for cancer for at least 20 years after your treatment. If Pap tests have been discontinued, risk factors (such as a new sexual partner) need to be reassessed to determine if screening should be resumed. Some women have medical problems that increase the chance of getting cervical cancer. In these cases, your caregiver may recommend more frequent screening and Pap tests.  The human papillomavirus (HPV) test is an additional test that may be used for cervical cancer screening. The HPV test looks for the virus that can cause the cell changes on the cervix. The cells collected during the Pap test can be tested for HPV. The HPV test could be used to screen women aged 30 years and older, and   should be used in women of any age who have unclear Pap test results. After the age of 56, women should have HPV testing at the same frequency as a Pap test.  Colorectal cancer can be detected and often prevented. Most routine colorectal cancer screening begins at the age of 5 and continues through age 55. However, your caregiver may recommend screening at an earlier age if you have risk factors for colon cancer. On a yearly basis, your caregiver may provide home test kits to check for hidden blood in the stool. Use of a small camera at the end of a tube, to directly examine the colon (sigmoidoscopy or colonoscopy), can detect the earliest forms of colorectal cancer. Talk to your caregiver about this at age 4, when routine screening begins. Direct examination of the colon should be repeated every 5 to 10 years through age 66, unless early forms of pre-cancerous polyps or small growths are found.  Hepatitis C blood testing is recommended for all people born from  [birth year] through 1965 and any individual with known risks for hepatitis C.  Practice safe sex. Use condoms and avoid high-risk sexual practices to reduce the spread of sexually transmitted infections (STIs). Sexually active women aged 52 and younger should be checked for Chlamydia, which is a common sexually transmitted infection. Older women with new or multiple partners should also be tested for Chlamydia. Testing for other STIs is recommended if you are sexually active and at increased risk.  Osteoporosis is a disease in which the bones lose minerals and strength with aging. This can result in serious bone fractures. The risk of osteoporosis can be identified using a bone density scan. Women ages 36 and over and women at risk for fractures or osteoporosis should discuss screening with their caregivers. Ask your caregiver whether you should be taking a calcium supplement or vitamin D to reduce the rate of osteoporosis.  Menopause can be associated with physical symptoms and risks. Hormone replacement therapy is available to decrease symptoms and risks. You should talk to your caregiver about whether hormone replacement therapy is right for you.  Use sunscreen. Apply sunscreen liberally and repeatedly throughout the day. You should seek shade when your shadow is shorter than you. Protect yourself by wearing long sleeves, pants, a wide-brimmed hat, and sunglasses year round, whenever you are outdoors.  Notify your caregiver of new moles or changes in moles, especially if there is a change in shape or color. Also notify your caregiver if a mole is larger than the size of a pencil eraser.  Stay current with your immunizations. Document Released: 11/07/2010 Document Revised: 08/19/2012 Document Reviewed: 11/07/2010 [facility] Patient Information 2014 [place].

## 2013-11-06 LAB — FOLLICLE STIMULATING HORMONE: FSH: 4.9 m[IU]/mL

## 2013-11-06 LAB — URINALYSIS W MICROSCOPIC + REFLEX CULTURE
BILIRUBIN URINE: NEGATIVE
Bacteria, UA: NONE SEEN
CASTS: NONE SEEN
CRYSTALS: NONE SEEN
Glucose, UA: NEGATIVE mg/dL
Hgb urine dipstick: NEGATIVE
KETONES UR: NEGATIVE mg/dL
Leukocytes, UA: NEGATIVE
Nitrite: NEGATIVE
PH: 8.5 — AB (ref 5.0–8.0)
Protein, ur: NEGATIVE mg/dL
SPECIFIC GRAVITY, URINE: 1.013 (ref 1.005–1.030)
SQUAMOUS EPITHELIAL / LPF: NONE SEEN
UROBILINOGEN UA: 0.2 mg/dL (ref 0.0–1.0)

## 2013-11-06 LAB — LIPID PANEL
CHOLESTEROL: 138 mg/dL (ref 0–200)
HDL: 55 mg/dL (ref >39)
LDL CALC: 54 mg/dL (ref 0–99)
TRIGLYCERIDES: 147 mg/dL (ref <150)
Total CHOL/HDL Ratio: 2.5 Ratio
VLDL: 29 mg/dL (ref 0–40)

## 2013-11-06 LAB — COMPREHENSIVE METABOLIC PANEL
ALBUMIN: 3.8 g/dL (ref 3.5–5.2)
ALT: 12 U/L (ref 0–35)
AST: 11 U/L (ref 0–37)
Alkaline Phosphatase: 36 U/L — ABNORMAL LOW (ref 39–117)
BUN: 14 mg/dL (ref 6–23)
CALCIUM: 9.2 mg/dL (ref 8.4–10.5)
CHLORIDE: 102 meq/L (ref 96–112)
CO2: 25 mEq/L (ref 19–32)
Creat: 0.65 mg/dL (ref 0.50–1.10)
GLUCOSE: 83 mg/dL (ref 70–99)
Potassium: 3.5 mEq/L (ref 3.5–5.3)
SODIUM: 135 meq/L (ref 135–145)
TOTAL PROTEIN: 6.1 g/dL (ref 6.0–8.3)
Total Bilirubin: 0.4 mg/dL (ref 0.2–1.2)

## 2013-11-06 LAB — TSH: TSH: 0.913 u[IU]/mL (ref 0.350–4.500)

## 2013-11-10 ENCOUNTER — Encounter: Payer: Self-pay | Admitting: Gynecology

## 2013-11-10 LAB — CYTOLOGY - PAP

## 2013-11-17 ENCOUNTER — Encounter: Payer: Self-pay | Admitting: Gynecology

## 2013-12-12 ENCOUNTER — Emergency Department (HOSPITAL_COMMUNITY)
Admission: EM | Admit: 2013-12-12 | Discharge: 2013-12-13 | Disposition: A | Discharge status: Home / Self Care | Payer: BC Managed Care – PPO | Attending: Emergency Medicine | Admitting: Emergency Medicine

## 2013-12-12 ENCOUNTER — Encounter (HOSPITAL_COMMUNITY): Payer: Self-pay | Admitting: Emergency Medicine

## 2013-12-12 DIAGNOSIS — J45909 Unspecified asthma, uncomplicated: Secondary | ICD-10-CM | POA: Insufficient documentation

## 2013-12-12 DIAGNOSIS — Z792 Long term (current) use of antibiotics: Secondary | ICD-10-CM | POA: Insufficient documentation

## 2013-12-12 DIAGNOSIS — K219 Gastro-esophageal reflux disease without esophagitis: Secondary | ICD-10-CM | POA: Insufficient documentation

## 2013-12-12 DIAGNOSIS — F3289 Other specified depressive episodes: Secondary | ICD-10-CM | POA: Insufficient documentation

## 2013-12-12 DIAGNOSIS — Z9889 Other specified postprocedural states: Secondary | ICD-10-CM | POA: Insufficient documentation

## 2013-12-12 DIAGNOSIS — F411 Generalized anxiety disorder: Secondary | ICD-10-CM | POA: Insufficient documentation

## 2013-12-12 DIAGNOSIS — R109 Unspecified abdominal pain: Secondary | ICD-10-CM | POA: Insufficient documentation

## 2013-12-12 DIAGNOSIS — Z90710 Acquired absence of both cervix and uterus: Secondary | ICD-10-CM | POA: Insufficient documentation

## 2013-12-12 DIAGNOSIS — N3010 Interstitial cystitis (chronic) without hematuria: Secondary | ICD-10-CM | POA: Insufficient documentation

## 2013-12-12 DIAGNOSIS — Z8739 Personal history of other diseases of the musculoskeletal system and connective tissue: Secondary | ICD-10-CM | POA: Insufficient documentation

## 2013-12-12 DIAGNOSIS — I10 Essential (primary) hypertension: Secondary | ICD-10-CM | POA: Insufficient documentation

## 2013-12-12 DIAGNOSIS — Z9851 Tubal ligation status: Secondary | ICD-10-CM | POA: Insufficient documentation

## 2013-12-12 DIAGNOSIS — Z8742 Personal history of other diseases of the female genital tract: Secondary | ICD-10-CM | POA: Insufficient documentation

## 2013-12-12 DIAGNOSIS — Z79899 Other long term (current) drug therapy: Secondary | ICD-10-CM | POA: Insufficient documentation

## 2013-12-12 DIAGNOSIS — F329 Major depressive disorder, single episode, unspecified: Secondary | ICD-10-CM | POA: Insufficient documentation

## 2013-12-12 DIAGNOSIS — Z86718 Personal history of other venous thrombosis and embolism: Secondary | ICD-10-CM | POA: Insufficient documentation

## 2013-12-12 DIAGNOSIS — Z880 Allergy status to penicillin: Secondary | ICD-10-CM | POA: Insufficient documentation

## 2013-12-12 DIAGNOSIS — Z9089 Acquired absence of other organs: Secondary | ICD-10-CM | POA: Insufficient documentation

## 2013-12-12 DIAGNOSIS — F17200 Nicotine dependence, unspecified, uncomplicated: Secondary | ICD-10-CM | POA: Insufficient documentation

## 2013-12-12 LAB — URINALYSIS, ROUTINE W REFLEX MICROSCOPIC
Bilirubin Urine: NEGATIVE
Glucose, UA: NEGATIVE mg/dL
Hgb urine dipstick: NEGATIVE
Ketones, ur: NEGATIVE mg/dL
LEUKOCYTES UA: NEGATIVE
Nitrite: NEGATIVE
PH: 6.5 (ref 5.0–8.0)
PROTEIN: NEGATIVE mg/dL
Specific Gravity, Urine: 1.014 (ref 1.005–1.030)
Urobilinogen, UA: 0.2 mg/dL (ref 0.0–1.0)

## 2013-12-12 LAB — BASIC METABOLIC PANEL
ANION GAP: 9 (ref 5–15)
BUN: 13 mg/dL (ref 6–23)
CALCIUM: 10.1 mg/dL (ref 8.4–10.5)
CO2: 29 meq/L (ref 19–32)
Chloride: 99 mEq/L (ref 96–112)
Creatinine, Ser: 0.78 mg/dL (ref 0.50–1.10)
GFR calc Af Amer: >90 mL/min (ref >90)
GFR calc non Af Amer: >90 mL/min (ref >90)
GLUCOSE: 95 mg/dL (ref 70–99)
Potassium: 3.6 mEq/L — ABNORMAL LOW (ref 3.7–5.3)
Sodium: 137 mEq/L (ref 137–147)

## 2013-12-12 LAB — CBC WITH DIFFERENTIAL/PLATELET
Basophils Absolute: 0 10*3/uL (ref 0.0–0.1)
Basophils Relative: 0 % (ref 0–1)
EOS PCT: 2 % (ref 0–5)
Eosinophils Absolute: 0.1 10*3/uL (ref 0.0–0.7)
HEMATOCRIT: 42.9 % (ref 36.0–46.0)
Hemoglobin: 14.7 g/dL (ref 12.0–15.0)
LYMPHS ABS: 2.1 10*3/uL (ref 0.7–4.0)
Lymphocytes Relative: 29 % (ref 12–46)
MCH: 30.6 pg (ref 26.0–34.0)
MCHC: 34.3 g/dL (ref 30.0–36.0)
MCV: 89.2 fL (ref 78.0–100.0)
Monocytes Absolute: 0.6 10*3/uL (ref 0.1–1.0)
Monocytes Relative: 8 % (ref 3–12)
Neutro Abs: 4.5 10*3/uL (ref 1.7–7.7)
Neutrophils Relative %: 61 % (ref 43–77)
PLATELETS: 197 10*3/uL (ref 150–400)
RBC: 4.81 MIL/uL (ref 3.87–5.11)
RDW: 13.2 % (ref 11.5–15.5)
WBC: 7.4 10*3/uL (ref 4.0–10.5)

## 2013-12-12 LAB — WET PREP, GENITAL
CLUE CELLS WET PREP: NONE SEEN
TRICH WET PREP: NONE SEEN
Yeast Wet Prep HPF POC: NONE SEEN

## 2013-12-12 MED ORDER — MORPHINE SULFATE 4 MG/ML IJ SOLN
4.0000 mg | Freq: Once | INTRAMUSCULAR | Status: AC
  Administered 2013-12-12: 4 mg via INTRAVENOUS
  Filled 2013-12-12: qty 1

## 2013-12-12 MED ORDER — SODIUM CHLORIDE 0.9 % IV BOLUS (SEPSIS)
1000.0000 mL | Freq: Once | INTRAVENOUS | Status: AC
  Administered 2013-12-12: 1000 mL via INTRAVENOUS

## 2013-12-12 MED ORDER — MORPHINE SULFATE 4 MG/ML IJ SOLN
4.0000 mg | Freq: Once | INTRAMUSCULAR | Status: AC
  Administered 2013-12-13: 4 mg via INTRAVENOUS
  Filled 2013-12-12: qty 1

## 2013-12-12 NOTE — ED Provider Notes (Signed)
CSN: [id number]     Arrival date & time 12/12/13  2020 History   First MD Initiated Contact with Patient 12/12/13 2130     Chief Complaint  Patient presents with  . Flank Pain     (Consider location/radiation/quality/duration/timing/severity/associated sxs/prior Treatment) HPI Comments: Patient presents to the emergency department with chief complaint of left-sided flank pain. She states that the pain started last Saturday. She reports a history of interstitial cystitis. She states that she takes  Elmiron and Toviaz for her symptoms.  She states that her pain is 10 out of 10. She also reports associated urinary frequency. She states that she has to go approximately every 15-30 minutes. She has not tried taking anything to alleviate her symptoms. She is followed by Dr. Marlou PorchHerrick, who advised her to come to the ED for evaluation of infection. She denies associated fevers, chills, nausea, vomiting, diarrhea, constipation, or vaginal discharge.  The history is provided by the patient. No language interpreter was used.    Past Medical History  Diagnosis Date  . Endometriosis   . Hypertension   . Asthma   . Interstitial cystitis   . Embolism - blood clot 2011    LEFT LEG  . GERD (gastroesophageal reflux disease)   . Carpal tunnel syndrome   . Anxiety   . Depression    Past Surgical History  Procedure Laterality Date  . Carpal tunnel release  2006  . Tubal ligation    . Pelvic laparoscopy  12/2009  . Bladder surgery  2011  . Vaginal hysterectomy  04/2010    [facility]  . Cholecystectomy     Family History  Problem Relation Age of Onset  . Diabetes Mother   . Hypertension Father    History  Substance Use Topics  . Smoking status: Current Every Day Smoker -- 0.25 packs/day    Types: Cigarettes  . Smokeless tobacco: Never Used  . Alcohol Use: No     Comment: Recovering alcoholic   OB History   Grav Para Term Preterm Abortions TAB SAB Ect Mult Living   3 3        3      Review of  Systems  All other systems reviewed and are negative.     Allergies  Penicillins  Home Medications   Prior to Admission medications   Medication Sig Start Date End Date Taking? Authorizing Provider  doxycycline (VIBRAMYCIN) 100 MG capsule Take 100 mg by mouth at bedtime as needed (chazalion cysts).    Yes Historical Provider, MD  fesoterodine (TOVIAZ) 8 MG TB24 tablet Take 8 mg by mouth daily.   Yes Historical Provider, MD  pantoprazole (PROTONIX) 40 MG tablet Take 40 mg by mouth daily.   Yes Historical Provider, MD  pentosan polysulfate (ELMIRON) 100 MG capsule Take 100 mg by mouth 3 (three) times daily.   Yes Historical Provider, MD  traZODone (DESYREL) 100 MG tablet Take 100 mg by mouth at bedtime.   Yes Historical Provider, MD  venlafaxine XR (EFFEXOR-XR) 150 MG 24 hr capsule Take 150 mg by mouth daily with breakfast.   Yes Historical Provider, MD   BP 131/94  Pulse 97  Temp(Src) 97.9 F (36.6 C) (Oral)  Resp 18  Ht 5' (1.524 m)  Wt 147 lb (66.679 kg)  BMI 28.71 kg/m2  SpO2 100% Physical Exam  Nursing note and vitals reviewed. Constitutional: She is oriented to person, place, and time. She appears well-developed and well-nourished.  HENT:  Head: Normocephalic and atraumatic.  Eyes:  Conjunctivae and EOM are normal. Pupils are equal, round, and reactive to light.  Neck: Normal range of motion. Neck supple.  Cardiovascular: Normal rate and regular rhythm.  Exam reveals no gallop and no friction rub.   No murmur heard. Pulmonary/Chest: Effort normal and breath sounds normal. No respiratory distress. She has no wheezes. She has no rales. She exhibits no tenderness.  Abdominal: Soft. Bowel sounds are normal. She exhibits no distension and no mass. There is no tenderness. There is no rebound and no guarding.  Genitourinary:  Pelvic exam chaperoned by female ER tech, no significant right or left adnexal tenderness, no uterine tenderness, mild vaginal discharge, no bleeding, no  CMT or friability, no foreign body, no injury to the external genitalia, no other significant findings   Musculoskeletal: Normal range of motion. She exhibits no edema and no tenderness.  Neurological: She is alert and oriented to person, place, and time.  Skin: Skin is warm and dry.  Psychiatric: She has a normal mood and affect. Her behavior is normal. Judgment and thought content normal.    ED Course  Procedures (including critical care time) Results for orders placed during the hospital encounter of 12/12/13  WET PREP, GENITAL      Result Value Ref Range   Yeast Wet Prep HPF POC NONE SEEN  NONE SEEN   Trich, Wet Prep NONE SEEN  NONE SEEN   Clue Cells Wet Prep HPF POC NONE SEEN  NONE SEEN   WBC, Wet Prep HPF POC RARE (*) NONE SEEN  URINALYSIS, ROUTINE W REFLEX MICROSCOPIC      Result Value Ref Range   Color, Urine YELLOW  YELLOW   APPearance CLEAR  CLEAR   Specific Gravity, Urine 1.014  1.005 - 1.030   pH 6.5  5.0 - 8.0   Glucose, UA NEGATIVE  NEGATIVE mg/dL   Hgb urine dipstick NEGATIVE  NEGATIVE   Bilirubin Urine NEGATIVE  NEGATIVE   Ketones, ur NEGATIVE  NEGATIVE mg/dL   Protein, ur NEGATIVE  NEGATIVE mg/dL   Urobilinogen, UA 0.2  0.0 - 1.0 mg/dL   Nitrite NEGATIVE  NEGATIVE   Leukocytes, UA NEGATIVE  NEGATIVE  CBC WITH DIFFERENTIAL      Result Value Ref Range   WBC 7.4  4.0 - 10.5 K/uL   RBC 4.81  3.87 - 5.11 MIL/uL   Hemoglobin 14.7  12.0 - 15.0 g/dL   HCT 16.1  09.6 - 04.5 %   MCV 89.2  78.0 - 100.0 fL   MCH 30.6  26.0 - 34.0 pg   MCHC 34.3  30.0 - 36.0 g/dL   RDW 40.9  81.1 - 91.4 %   Platelets 197  150 - 400 K/uL   Neutrophils Relative % 61  43 - 77 %   Neutro Abs 4.5  1.7 - 7.7 K/uL   Lymphocytes Relative 29  12 - 46 %   Lymphs Abs 2.1  0.7 - 4.0 K/uL   Monocytes Relative 8  3 - 12 %   Monocytes Absolute 0.6  0.1 - 1.0 K/uL   Eosinophils Relative 2  0 - 5 %   Eosinophils Absolute 0.1  0.0 - 0.7 K/uL   Basophils Relative 0  0 - 1 %   Basophils Absolute  0.0  0.0 - 0.1 K/uL  BASIC METABOLIC PANEL      Result Value Ref Range   Sodium 137  137 - 147 mEq/L   Potassium 3.6 (*) 3.7 - 5.3 mEq/L  Chloride 99  96 - 112 mEq/L   CO2 29  19 - 32 mEq/L   Glucose, Bld 95  70 - 99 mg/dL   BUN 13  6 - 23 mg/dL   Creatinine, Ser 1.61  0.50 - 1.10 mg/dL   Calcium 09.6  8.4 - 04.5 mg/dL   GFR calc non Af Amer >90  >90 mL/min   GFR calc Af Amer >90  >90 mL/min   Anion gap 9  5 - 15   No results found.   Imaging Review No results found.   EKG Interpretation None      MDM   Final diagnoses:  IC (interstitial cystitis)    Patient with IC, and increased pain.  Sent to the emergency department for evaluation of UTI.  Will check labs and reassess.   Patient feeling better after pain meds.  Pelvic exam is fairly unremarkable.  Patient seen by and discussed with Dr. Romeo Apple.  Will treat pain with percocet.  Discharge to home with urology follow-up.  Patient understands and agrees with the plan.  She is stable and ready for discharge.   Roxy Horseman, PA-C 12/13/13 0004

## 2013-12-12 NOTE — ED Notes (Signed)
Pt reports left flank pain and urinary frequency since Saturday.  Pt has hx of IC.  Denies n/v at this time.

## 2013-12-13 LAB — GC/CHLAMYDIA PROBE AMP
CT PROBE, AMP APTIMA: NEGATIVE
GC Probe RNA: NEGATIVE

## 2013-12-13 MED ORDER — OXYCODONE-ACETAMINOPHEN 5-325 MG PO TABS: 2.0000 | ORAL_TABLET | Freq: Four times a day (QID) | ORAL | Status: DC | PRN

## 2013-12-13 NOTE — ED Provider Notes (Signed)
Medical screening examination/treatment/procedure(s) were conducted as a shared visit with non-physician practitioner(s) and myself.  I personally evaluated the patient during the encounter.   EKG Interpretation None      I interviewed and examined the patient. Lungs are CTAB. Cardiac exam wnl. Abdomen soft.  Pain likely related to IC. Pain improved w/ pain control in ED. I interpreted/reviewed the labs and/or imaging which were non-contributory.  Will send home w/ stronger pain medicine and rec f/u w/ her urologist next week.   Purvis SheffieldForrest Reynard Christoffersen, MD 12/13/13 [phone]

## 2013-12-13 NOTE — Discharge Instructions (Signed)

## 2013-12-14 LAB — URINE CULTURE

## 2013-12-25 ENCOUNTER — Other Ambulatory Visit: Payer: Self-pay | Admitting: Family Medicine

## 2013-12-25 DIAGNOSIS — R109 Unspecified abdominal pain: Secondary | ICD-10-CM

## 2013-12-29 ENCOUNTER — Ambulatory Visit
Admission: RE | Admit: 2013-12-29 | Discharge: 2013-12-29 | Disposition: A | Discharge status: Home / Self Care | Payer: BC Managed Care – PPO | Source: Ambulatory Visit | Attending: Family Medicine | Admitting: Family Medicine

## 2013-12-29 DIAGNOSIS — R109 Unspecified abdominal pain: Secondary | ICD-10-CM

## 2013-12-29 MED ORDER — IOHEXOL 300 MG/ML  SOLN
100.0000 mL | Freq: Once | INTRAMUSCULAR | Status: AC | PRN
  Administered 2013-12-29: 100 mL via INTRAVENOUS

## 2014-01-09 ENCOUNTER — Other Ambulatory Visit: Payer: Self-pay | Admitting: Urology

## 2014-01-14 ENCOUNTER — Encounter (HOSPITAL_BASED_OUTPATIENT_CLINIC_OR_DEPARTMENT_OTHER): Payer: Self-pay | Admitting: *Deleted

## 2014-01-15 ENCOUNTER — Encounter (HOSPITAL_BASED_OUTPATIENT_CLINIC_OR_DEPARTMENT_OTHER): Payer: Self-pay | Admitting: *Deleted

## 2014-01-15 NOTE — Progress Notes (Signed)
NPO AFTER MN. ARRIVE AT 1000. NEEDS HG. WILL TAKE EFFEXOR AND PROTONIX AM DOS W/ SIPS OF WATER AND IF NEEDED TAKE PAIN RX.

## 2014-01-21 ENCOUNTER — Encounter (HOSPITAL_BASED_OUTPATIENT_CLINIC_OR_DEPARTMENT_OTHER): Payer: BC Managed Care – PPO | Admitting: Anesthesiology

## 2014-01-21 ENCOUNTER — Ambulatory Visit (HOSPITAL_BASED_OUTPATIENT_CLINIC_OR_DEPARTMENT_OTHER)
Admission: RE | Admit: 2014-01-21 | Discharge: 2014-01-21 | Disposition: A | Discharge status: Home / Self Care | Payer: BC Managed Care – PPO | Source: Ambulatory Visit | Attending: Urology | Admitting: Urology

## 2014-01-21 ENCOUNTER — Encounter (HOSPITAL_BASED_OUTPATIENT_CLINIC_OR_DEPARTMENT_OTHER)
Admission: RE | Disposition: A | Discharge status: Home / Self Care | Payer: Self-pay | Source: Ambulatory Visit | Attending: Urology

## 2014-01-21 ENCOUNTER — Encounter (HOSPITAL_BASED_OUTPATIENT_CLINIC_OR_DEPARTMENT_OTHER): Payer: Self-pay | Admitting: *Deleted

## 2014-01-21 ENCOUNTER — Ambulatory Visit (HOSPITAL_BASED_OUTPATIENT_CLINIC_OR_DEPARTMENT_OTHER): Payer: BC Managed Care – PPO | Admitting: Anesthesiology

## 2014-01-21 DIAGNOSIS — J45909 Unspecified asthma, uncomplicated: Secondary | ICD-10-CM | POA: Diagnosis not present

## 2014-01-21 DIAGNOSIS — Z79899 Other long term (current) drug therapy: Secondary | ICD-10-CM | POA: Diagnosis not present

## 2014-01-21 DIAGNOSIS — F329 Major depressive disorder, single episode, unspecified: Secondary | ICD-10-CM | POA: Insufficient documentation

## 2014-01-21 DIAGNOSIS — F3289 Other specified depressive episodes: Secondary | ICD-10-CM | POA: Insufficient documentation

## 2014-01-21 DIAGNOSIS — F411 Generalized anxiety disorder: Secondary | ICD-10-CM | POA: Diagnosis not present

## 2014-01-21 DIAGNOSIS — F17200 Nicotine dependence, unspecified, uncomplicated: Secondary | ICD-10-CM | POA: Insufficient documentation

## 2014-01-21 DIAGNOSIS — I10 Essential (primary) hypertension: Secondary | ICD-10-CM | POA: Insufficient documentation

## 2014-01-21 DIAGNOSIS — M129 Arthropathy, unspecified: Secondary | ICD-10-CM | POA: Insufficient documentation

## 2014-01-21 DIAGNOSIS — Z86718 Personal history of other venous thrombosis and embolism: Secondary | ICD-10-CM | POA: Insufficient documentation

## 2014-01-21 DIAGNOSIS — N3010 Interstitial cystitis (chronic) without hematuria: Secondary | ICD-10-CM | POA: Diagnosis present

## 2014-01-21 HISTORY — DX: Other symptoms and signs involving the genitourinary system: R39.89

## 2014-01-21 HISTORY — DX: Other intervertebral disc displacement, lumbar region: M51.26

## 2014-01-21 HISTORY — DX: Other intervertebral disc degeneration, lumbar region without mention of lumbar back pain or lower extremity pain: M51.369

## 2014-01-21 HISTORY — DX: Presence of spectacles and contact lenses: Z97.3

## 2014-01-21 HISTORY — DX: Other intervertebral disc degeneration, lumbar region: M51.36

## 2014-01-21 HISTORY — DX: Personal history of other venous thrombosis and embolism: Z86.718

## 2014-01-21 HISTORY — PX: CYSTO WITH HYDRODISTENSION: SHX5453

## 2014-01-21 LAB — POCT PREGNANCY, URINE: PREG TEST UR: NEGATIVE

## 2014-01-21 LAB — POCT HEMOGLOBIN-HEMACUE: HEMOGLOBIN: 14.5 g/dL (ref 12.0–15.0)

## 2014-01-21 SURGERY — CYSTOSCOPY, WITH BLADDER HYDRODISTENSION
Anesthesia: General | Site: Bladder

## 2014-01-21 MED ORDER — LACTATED RINGERS IV SOLN
INTRAVENOUS | Status: DC
  Administered 2014-01-21: 11:00:00 via INTRAVENOUS
  Filled 2014-01-21: qty 1000

## 2014-01-21 MED ORDER — CIPROFLOXACIN IN D5W 400 MG/200ML IV SOLN
INTRAVENOUS | Status: AC
Start: 2014-01-21 — End: 2014-01-21
  Filled 2014-01-21: qty 200

## 2014-01-21 MED ORDER — OXYCODONE-ACETAMINOPHEN 5-325 MG PO TABS
1.0000 | ORAL_TABLET | ORAL | Status: DC | PRN
  Administered 2014-01-21: 1 via ORAL
  Filled 2014-01-21: qty 1

## 2014-01-21 MED ORDER — FENTANYL CITRATE 0.05 MG/ML IJ SOLN
INTRAMUSCULAR | Status: AC
  Filled 2014-01-21: qty 4

## 2014-01-21 MED ORDER — MEPERIDINE HCL 25 MG/ML IJ SOLN
6.2500 mg | INTRAMUSCULAR | Status: DC | PRN
  Filled 2014-01-21: qty 1

## 2014-01-21 MED ORDER — DEXAMETHASONE SODIUM PHOSPHATE 10 MG/ML IJ SOLN
INTRAMUSCULAR | Status: DC | PRN
  Administered 2014-01-21: 10 mg via INTRAVENOUS

## 2014-01-21 MED ORDER — CIPROFLOXACIN IN D5W 400 MG/200ML IV SOLN
400.0000 mg | INTRAVENOUS | Status: AC
  Administered 2014-01-21: 400 mg via INTRAVENOUS
  Filled 2014-01-21: qty 200

## 2014-01-21 MED ORDER — PROMETHAZINE HCL 25 MG/ML IJ SOLN
6.2500 mg | INTRAMUSCULAR | Status: DC | PRN
  Filled 2014-01-21: qty 1

## 2014-01-21 MED ORDER — STERILE WATER FOR IRRIGATION IR SOLN
Status: DC | PRN
  Administered 2014-01-21: 3000 mL

## 2014-01-21 MED ORDER — FENTANYL CITRATE 0.05 MG/ML IJ SOLN
INTRAMUSCULAR | Status: DC | PRN
  Administered 2014-01-21 (×2): 50 ug via INTRAVENOUS

## 2014-01-21 MED ORDER — OXYCODONE HCL 5 MG PO TABS
5.0000 mg | ORAL_TABLET | Freq: Once | ORAL | Status: DC | PRN
  Filled 2014-01-21: qty 1

## 2014-01-21 MED ORDER — MIDAZOLAM HCL 2 MG/2ML IJ SOLN
INTRAMUSCULAR | Status: AC
  Filled 2014-01-21: qty 2

## 2014-01-21 MED ORDER — MIDAZOLAM HCL 5 MG/5ML IJ SOLN
INTRAMUSCULAR | Status: DC | PRN
  Administered 2014-01-21: 2 mg via INTRAVENOUS

## 2014-01-21 MED ORDER — PROPOFOL INFUSION 10 MG/ML OPTIME
INTRAVENOUS | Status: DC | PRN
  Administered 2014-01-21: 120 mL via INTRAVENOUS
  Administered 2014-01-21: 30 mL via INTRAVENOUS

## 2014-01-21 MED ORDER — OXYCODONE-ACETAMINOPHEN 5-325 MG PO TABS
ORAL_TABLET | ORAL | Status: AC
  Filled 2014-01-21: qty 1

## 2014-01-21 MED ORDER — PHENAZOPYRIDINE HCL 200 MG PO TABS
ORAL_TABLET | ORAL | Status: DC | PRN
  Administered 2014-01-21: 12:00:00 via INTRAVESICAL

## 2014-01-21 MED ORDER — LIDOCAINE HCL (CARDIAC) 20 MG/ML IV SOLN
INTRAVENOUS | Status: DC | PRN
  Administered 2014-01-21: 50 mg via INTRAVENOUS

## 2014-01-21 MED ORDER — OXYCODONE HCL 5 MG/5ML PO SOLN
5.0000 mg | Freq: Once | ORAL | Status: DC | PRN
Start: 2014-01-21 — End: 2014-01-21
  Filled 2014-01-21: qty 5

## 2014-01-21 MED ORDER — HYDROMORPHONE HCL 1 MG/ML IJ SOLN
0.2500 mg | INTRAMUSCULAR | Status: DC | PRN
  Filled 2014-01-21: qty 1

## 2014-01-21 MED ORDER — ONDANSETRON HCL 4 MG/2ML IJ SOLN
INTRAMUSCULAR | Status: DC | PRN
  Administered 2014-01-21: 4 mg via INTRAVENOUS

## 2014-01-21 SURGICAL SUPPLY — 21 items
BAG DRAIN URO-CYSTO SKYTR STRL (DRAIN) ×3 IMPLANT
BAG DRN UROCATH (DRAIN) ×1
CANISTER SUCT LVC 12 LTR MEDI- (MISCELLANEOUS) ×3 IMPLANT
CATH ROBINSON RED A/P 14FR (CATHETERS) IMPLANT
CATH ROBINSON RED A/P 16FR (CATHETERS) ×3 IMPLANT
CLOTH BEACON ORANGE TIMEOUT ST (SAFETY) ×3 IMPLANT
DRAPE CAMERA CLOSED 9X96 (DRAPES) ×3 IMPLANT
ELECT REM PT RETURN 9FT ADLT (ELECTROSURGICAL) ×3
ELECTRODE REM PT RTRN 9FT ADLT (ELECTROSURGICAL) ×1 IMPLANT
GLOVE BIO SURGEON STRL SZ7.5 (GLOVE) ×3 IMPLANT
GLOVE BIOGEL PI IND STRL 6.5 (GLOVE) ×2 IMPLANT
GLOVE BIOGEL PI INDICATOR 6.5 (GLOVE) ×4
GOWN STRL REUS W/TWL LRG LVL3 (GOWN DISPOSABLE) ×6 IMPLANT
GOWN STRL REUS W/TWL XL LVL3 (GOWN DISPOSABLE) IMPLANT
NDL SAFETY ECLIPSE 18X1.5 (NEEDLE) ×1 IMPLANT
NEEDLE HYPO 18GX1.5 BLUNT FILL (NEEDLE) ×3 IMPLANT
NEEDLE HYPO 18GX1.5 SHARP (NEEDLE) ×2
PACK CYSTO (CUSTOM PROCEDURE TRAY) ×3 IMPLANT
PACK CYSTOSCOPY (CUSTOM PROCEDURE TRAY) IMPLANT
SYR 20CC LL (SYRINGE) ×3 IMPLANT
WATER STERILE IRR 3000ML UROMA (IV SOLUTION) ×3 IMPLANT

## 2014-01-21 NOTE — Anesthesia Postprocedure Evaluation (Signed)
Anesthesia Post Note  Patient: Victoria Camacho  Procedure(s) Performed: Procedure(s) (LRB): CYSTO WITH HYDRODISTENSION, AND INSTILLATION OF MARCAINE AND PYRIDIUM (N/A)  Anesthesia type: General  Patient location: PACU  Post pain: Pain level controlled  Post assessment: Post-op Vital signs reviewed  Last Vitals: BP 115/87  Pulse 88  Temp(Src) 36.5 C (Oral)  Resp 16  Ht 5' (1.524 m)  Wt 144 lb (65.318 kg)  BMI 28.12 kg/m2  SpO2 97%  Post vital signs: Reviewed  Level of consciousness: sedated  Complications: No apparent anesthesia complications

## 2014-01-21 NOTE — Anesthesia Procedure Notes (Signed)
Procedure Name: LMA Insertion Date/Time: 01/21/2014 11:30 AM Performed by: Tyrone Nine Pre-anesthesia Checklist: Patient identified, Timeout performed, Emergency Drugs available, Suction available and Patient being monitored Patient Re-evaluated:Patient Re-evaluated prior to inductionOxygen Delivery Method: Circle system utilized Preoxygenation: Pre-oxygenation with 100% oxygen Intubation Type: IV induction Ventilation: Mask ventilation without difficulty LMA: LMA inserted LMA Size: 4.0 Number of attempts: 1 Airway Equipment and Method: Bite block Tube secured with: Tape

## 2014-01-21 NOTE — H&P (Signed)
Reason For Visit Ongoing pelvic and left flank pain.   History of Present Illness Today the patient follows up having seen numerous other providers in our practice and had multiple physical therapy sessions. She states that she is no better off today having been on Elmiron since I last saw her, altered her diet so that she is mostly consuming only water and bland food, she has tried multiple bladder instillations unsuccessfully, has had numerous physical therapy sessions, and is currently taking narcotics for her pain. Additionally patient has had a renal ultrasound and a follow-up abdomen and pelvis CAT scan which were largely unrevealing. She did have peripelvic cysts bilaterally with no evidence of obstruction. The patient is also failed myrbetriq and is currently taking Toviaz 10 mg but is not getting much relief. She continues to void approximately every 15-20 minutes. She denies any hematuria, fevers, or chills. She denies any urinary incontinence.   Past Medical History Problems  1. History of Anxiety (300.00) 2. History of Asthma (493.90) 3. History of Chronic interstitial cystitis without hematuria (595.1) 4. History of arthritis (V13.4) 5. History of deep venous thrombosis (V12.51) 6. History of depression (V11.8) 7. History of heartburn (V12.79) 8. History of hypertension (V12.59)  Surgical History Problems  1. History of Bladder Irrigation 2. History of Cholecystectomy Laparoscopic 3. History of Cystoscopy With Dilation Of Bladder 4. History of Cystoscopy With Dilation Of Bladder 5. History of Hand Surgery 6. History of Hysterectomy 7. History of Neuroplasty Decompression Median Nerve At Carpal Tunnel  Current Meds 1. Elmiron 100 MG Oral Capsule; TAKE 1 CAPSULE 3 TIMES DAILY BEFORE MEALS;  Therapy: 29Jun2015 to (Evaluate:28Aug2015)  Requested for: 29Jun2015; Last  Rx:29Jun2015 Ordered 2. Hydrocodone-Acetaminophen 5-325 MG Oral Tablet; take 1 tablet q 6 hours prn pain;  Therapy: 17Aug2015 to (Evaluate:29Aug2015); Last Rx:26Aug2015 Ordered 3. Methocarbamol 500 MG Oral Tablet; TAKE 1 TABLET Twice daily PRN;  Therapy: 13Aug2015 to (Evaluate:09Sep2015)  Requested for: 25Aug2015; Last  Rx:25Aug2015 Ordered 4. Pantoprazole Sodium 40 MG Oral Tablet Delayed Release;  Therapy: 14Mar2012 to Recorded 5. Toviaz 8 MG Oral Tablet Extended Release 24 Hour; TAKE 1 TABLET DAILY;  Therapy: 08Jul2015 to (Last Rx:03Aug2015)  Requested for: 03Aug2015 Ordered 6. TraZODone HCl - 50 MG Oral Tablet;  Therapy: (Recorded:29Jun2015) to Recorded 7. Venlafaxine HCl ER 75 MG Oral Capsule Extended Release 24 Hour;  Therapy: (Recorded:29Jun2015) to Recorded  Allergies Medication  1. Penicillins  Family History Problems  1. Family history of Death In The Family Mother : Mother   70- Rheumatoid Arthritis 2. Family history of Family Health Status - Father's Age   30 3. Family history of Family Health Status Number Of Children   3 sons  19,14,11 4. Family history of hypertension (V17.49) : Father 5. Family history of kidney stones (V18.69) : Father 6. Family history of Nephrolithiasis : Father 7. Family history of Rheumatoid Arthritis : Mother  Social History Problems  1. Activities of daily living (ADL's), independent 2. Denied: History of Alcohol Use   only on vacations 3. Denied: History of Caffeine Use 4. Current every day smoker (305.1) 5. Exercise habits   No formal exercise at this time although she indicates she is quite busy at work, caring      for children. 6. Marital History - Currently Married 7. Occupation:   Ambulance person 8. Three children 9. Tobacco Use (V15.82)   smoked 1/2 ppd for 5 yrs & quit 20 yrs ago  Results/Data Urine [Data Includes: Last 1 Day]   03Sep2015  COLOR YELLOW   APPEARANCE CLEAR   SPECIFIC GRAVITY 1.010   pH 7.5   GLUCOSE NEG mg/dL  BILIRUBIN NEG   KETONE NEG mg/dL  BLOOD NEG   PROTEIN NEG mg/dL  UROBILINOGEN 0.2  mg/dL  NITRITE NEG   LEUKOCYTE ESTERASE NEG    The patient's urinalysis today demonstrates no evidence of infection, inflammation, or microscopic hematuria.   Assessment Patient has intractable bladder pain/interstitial cystitis.   Plan Chronic interstitial cystitis without hematuria  1. Start: Diazepam 10 MG Oral Tablet; TAKE 1 TABLET Daily insert into Vagina prior to  bedtime 2. Renew: Hydrocodone-Acetaminophen 5-325 MG Oral Tablet; take 1 tablet q 6 hours prn  pain 3. Follow-up Schedule Surgery Office  Follow-up  Status: Complete  Done: 03Sep2015 Health Maintenance  4. UA With REFLEX; [Do Not Release]; Status:Complete;   Done: 03Sep2015 03:46PM  Discussion/Summary At this point traditional therapies have failed. As such, I recommended a trial of hydrodistention. Further, I added Uribel to her regimen in hopes that this may help with some of her dysuria. She will contact us if she thinks that this is helping. I also refilled her prescription for Percocet. In addition, given her a prescription for intravaginal valium which may help relax her pelvic floor some. We also discussed the interstitial cystitis research trial.

## 2014-01-21 NOTE — Discharge Instructions (Signed)
Cystoscopy Cystoscopy is a procedure that is used to help your caregiver diagnose and sometimes treat conditions that affect your lower urinary tract. Your lower urinary tract includes your bladder and the tube through which urine passes from your bladder out of your body (urethra). Cystoscopy is performed with a thin, tube-shaped instrument (cystoscope). The cystoscope has lenses and a light at the end so that your caregiver can see inside your bladder. The cystoscope is inserted at the entrance of your urethra. Your caregiver guides it through your urethra and into your bladder. There are two main types of cystoscopy:  Flexible cystoscopy (with a flexible cystoscope).  Rigid cystoscopy (with a rigid cystoscope). Cystoscopy may be recommended for many conditions, including:  Urinary tract infections.  Blood in your urine (hematuria).  Loss of bladder control (urinary incontinence) or overactive bladder.  Unusual cells found in a urine sample.  Urinary blockage.  Painful urination. Cystoscopy may also be done to remove a sample of your tissue to be checked under a microscope (biopsy). It may also be done to remove or destroy bladder stones. LET YOUR CAREGIVER KNOW ABOUT:  Allergies to food or medicine.  Medicines taken, including vitamins, herbs, eyedrops, over-the-counter medicines, and creams.  Use of steroids (by mouth or creams).  Previous problems with anesthetics or numbing medicines.  History of bleeding problems or blood clots.  Previous surgery.  Other health problems, including diabetes and kidney problems.  Possibility of pregnancy, if this applies. PROCEDURE The area around the opening to your urethra will be cleaned. A medicine to numb your urethra (local anesthetic) is used. If a tissue sample or stone is removed during the procedure, you may be given a medicine to make you sleep (general anesthetic). Your caregiver will gently insert the tip of the cystoscope  into your urethra. The cystoscope will be slowly glided through your urethra and into your bladder. Sterile fluid will flow through the cystoscope and into your bladder. The fluid will expand and stretch your bladder. This gives your caregiver a better view of your bladder walls. The procedure lasts about 15-20 minutes. AFTER THE PROCEDURE If a local anesthetic is used, you will be allowed to go home as soon as you are ready. If a general anesthetic is used, you will be taken to a recovery area until you are stable. You may have temporary bleeding and burning on urination.  Document Released: 04/21/2000 Document Revised: 01/17/2012 Document Reviewed: 10/16/2011 [facility] Patient Information 2015 [place], [place]. This information is not intended to replace advice given to you by your health care provider. Make sure you discuss any questions you have with your health care provider.   Post Anesthesia Home Care Instructions  Activity: Get plenty of rest for the remainder of the day. A responsible adult should stay with you for 24 hours following the procedure.  For the next 24 hours, DO NOT: -Drive a car -Advertising copywriter -Drink alcoholic beverages -Take any medication unless instructed by your physician -Make any legal decisions or sign important papers.  Meals: Start with liquid foods such as gelatin or soup. Progress to regular foods as tolerated. Avoid greasy, spicy, heavy foods. If nausea and/or vomiting occur, drink only clear liquids until the nausea and/or vomiting subsides. Call your physician if vomiting continues.  Special Instructions/Symptoms: Your throat may feel dry or sore from the anesthesia or the breathing tube placed in your throat during surgery. If this causes discomfort, gargle with warm salt water. The discomfort should disappear within 24 hours.

## 2014-01-21 NOTE — Interval H&P Note (Signed)
History and Physical Interval Note:  01/21/2014 11:21 AM  Victoria Camacho  has presented today for surgery, with the diagnosis of BLADDER PAIN  The various methods of treatment have been discussed with the patient and family. After consideration of risks, benefits and other options for treatment, the patient has consented to  Procedure(s): CYSTO WITH HYDRODISTENSION (N/A) as a surgical intervention .  The patient's history has been reviewed, patient examined, no change in status, stable for surgery.  I have reviewed the patient's chart and labs.  Questions were answered to the patient's satisfaction.     Berniece Salines W

## 2014-01-21 NOTE — Op Note (Signed)
Preoperative diagnosis:  1. Bladder pain 2. Interstitial cystitis  Postoperative diagnosis:  1. As above   Procedure: 1. Hydrodistention 2. Cystoscopy 3. Insertion of intravesical Pyridium/Marcaine mixed  Surgeon: Crist Fat, MD  Anesthesia: General  Complications: None  Intraoperative findings: Patient's bladder capacity was approximately 500 cc. There was hypervascularity but hunner ulcers or significant bleeding.  EBL: Minimal  Specimens: None  Indication: Victoria Camacho is a 43 y.o. patient with severe bladder discomfort and pelvic pain. She has essentially failed medical management and physical therapy. After reviewing the management options for treatment, he elected to proceed with the above surgical procedure(s). We have discussed the potential benefits and risks of the procedure, side effects of the proposed treatment, the likelihood of the patient achieving the goals of the procedure, and any potential problems that might occur during the procedure or recuperation. Informed consent has been obtained.  Description of procedure:  The patient was taken to the operating room and general anesthesia was induced.  The patient was placed in the dorsal lithotomy position, prepped and draped in the usual sterile fashion, and preoperative antibiotics were administered. A preoperative time-out was performed.   A 22 French cystoscope was then gently passed to the patient's urethra and into the bladder. A 3-60 cystoscopic evaluation was performed with no mucosal abnormalities. The UOs were in orthotopic position. The bladder looked mucosa healthy. The bladder was then emptied and filled up with normal saline which was hung at 80 cm above the patient's bladder. The bladder was filled up until the urethra began to leak which was noted to be approximately 500 cc. The bladder was then emptied and was refilled. Once at capacity we held 500 cc in her bladder for proximally 5  minutes. I then emptied the bladder and inserted a 14 French red rubber catheter and instilled 20 cc of Pyridium/Marcaine mixed in a 50-50 solution. The patient was subsequently awoken and returned back in excellent condition.   Disposition: the patient is being discharged home with followup scheduled in 6 weeks.  Crist Fat, M.D.

## 2014-01-21 NOTE — Anesthesia Preprocedure Evaluation (Addendum)
Anesthesia Evaluation  Patient identified by MRN, date of birth, ID band Patient awake    Reviewed: Allergy & Precautions, H&P , NPO status , Patient's Chart, lab work & pertinent test results  Airway Mallampati: II TM Distance: >3 FB Neck ROM: Full    Dental no notable dental hx. (+) Dental Advisory Given   Pulmonary asthma , Current Smoker,  breath sounds clear to auscultation  Pulmonary exam normal       Cardiovascular hypertension, Pt. on medications DVT Rhythm:Regular Rate:Normal     Neuro/Psych PSYCHIATRIC DISORDERS Anxiety Depression negative neurological ROS     GI/Hepatic Neg liver ROS, GERD-  Medicated,  Endo/Other  negative endocrine ROS  Renal/GU negative Renal ROS     Musculoskeletal negative musculoskeletal ROS (+)   Abdominal   Peds  Hematology negative hematology ROS (+)   Anesthesia Other Findings   Reproductive/Obstetrics negative OB ROS                         Anesthesia Physical Anesthesia Plan  ASA: II  Anesthesia Plan: General   Post-op Pain Management:    Induction: Intravenous  Airway Management Planned: LMA  Additional Equipment:   Intra-op Plan:   Post-operative Plan: Extubation in OR  Informed Consent: I have reviewed the patients History and Physical, chart, labs and discussed the procedure including the risks, benefits and alternatives for the proposed anesthesia with the patient or authorized representative who has indicated his/her understanding and acceptance.   Dental advisory given  Plan Discussed with: CRNA  Anesthesia Plan Comments:         Anesthesia Quick Evaluation

## 2014-01-21 NOTE — Transfer of Care (Signed)
Immediate Anesthesia Transfer of Care Note  Patient: Victoria Camacho  Procedure(s) Performed: Procedure(s): CYSTO WITH HYDRODISTENSION, AND INSTILLATION OF MARCAINE AND PYRIDIUM (N/A)  Patient Location: PACU  Anesthesia Type:General  Level of Consciousness: awake and alert   Airway & Oxygen Therapy: Patient Spontanous Breathing  Post-op Assessment: Report given to PACU RN and Post -op Vital signs reviewed and stable  Post vital signs: Reviewed  Complications: No apparent anesthesia complications

## 2014-01-22 ENCOUNTER — Encounter (HOSPITAL_BASED_OUTPATIENT_CLINIC_OR_DEPARTMENT_OTHER): Payer: Self-pay | Admitting: Urology

## 2014-03-09 ENCOUNTER — Emergency Department (HOSPITAL_COMMUNITY): Payer: BC Managed Care – PPO

## 2014-03-09 ENCOUNTER — Emergency Department (HOSPITAL_COMMUNITY)
Admission: EM | Admit: 2014-03-09 | Discharge: 2014-03-09 | Disposition: A | Discharge status: Home / Self Care | Payer: BC Managed Care – PPO | Attending: Emergency Medicine | Admitting: Emergency Medicine

## 2014-03-09 ENCOUNTER — Encounter (HOSPITAL_COMMUNITY): Payer: Self-pay | Admitting: Emergency Medicine

## 2014-03-09 DIAGNOSIS — Z79899 Other long term (current) drug therapy: Secondary | ICD-10-CM | POA: Diagnosis not present

## 2014-03-09 DIAGNOSIS — K219 Gastro-esophageal reflux disease without esophagitis: Secondary | ICD-10-CM | POA: Insufficient documentation

## 2014-03-09 DIAGNOSIS — R319 Hematuria, unspecified: Secondary | ICD-10-CM | POA: Insufficient documentation

## 2014-03-09 DIAGNOSIS — M549 Dorsalgia, unspecified: Secondary | ICD-10-CM

## 2014-03-09 DIAGNOSIS — M545 Low back pain: Secondary | ICD-10-CM | POA: Diagnosis present

## 2014-03-09 DIAGNOSIS — Z8742 Personal history of other diseases of the female genital tract: Secondary | ICD-10-CM | POA: Insufficient documentation

## 2014-03-09 DIAGNOSIS — F419 Anxiety disorder, unspecified: Secondary | ICD-10-CM | POA: Insufficient documentation

## 2014-03-09 DIAGNOSIS — Z86718 Personal history of other venous thrombosis and embolism: Secondary | ICD-10-CM | POA: Diagnosis not present

## 2014-03-09 DIAGNOSIS — Z720 Tobacco use: Secondary | ICD-10-CM | POA: Insufficient documentation

## 2014-03-09 DIAGNOSIS — J45909 Unspecified asthma, uncomplicated: Secondary | ICD-10-CM | POA: Diagnosis not present

## 2014-03-09 DIAGNOSIS — F329 Major depressive disorder, single episode, unspecified: Secondary | ICD-10-CM | POA: Diagnosis not present

## 2014-03-09 DIAGNOSIS — Z3202 Encounter for pregnancy test, result negative: Secondary | ICD-10-CM | POA: Insufficient documentation

## 2014-03-09 DIAGNOSIS — Z880 Allergy status to penicillin: Secondary | ICD-10-CM | POA: Diagnosis not present

## 2014-03-09 DIAGNOSIS — I10 Essential (primary) hypertension: Secondary | ICD-10-CM | POA: Diagnosis not present

## 2014-03-09 LAB — URINALYSIS, ROUTINE W REFLEX MICROSCOPIC
BILIRUBIN URINE: NEGATIVE
Glucose, UA: NEGATIVE mg/dL
KETONES UR: NEGATIVE mg/dL
LEUKOCYTES UA: NEGATIVE
Nitrite: NEGATIVE
PROTEIN: NEGATIVE mg/dL
Specific Gravity, Urine: 1.021 (ref 1.005–1.030)
UROBILINOGEN UA: 0.2 mg/dL (ref 0.0–1.0)
pH: 7 (ref 5.0–8.0)

## 2014-03-09 LAB — PREGNANCY, URINE: Preg Test, Ur: NEGATIVE

## 2014-03-09 LAB — URINE MICROSCOPIC-ADD ON

## 2014-03-09 MED ORDER — HYDROCODONE-ACETAMINOPHEN 5-325 MG PO TABS
1.0000 | ORAL_TABLET | Freq: Once | ORAL | Status: AC
  Administered 2014-03-09: 1 via ORAL
  Filled 2014-03-09: qty 1

## 2014-03-09 MED ORDER — IBUPROFEN 800 MG PO TABS
800.0000 mg | ORAL_TABLET | Freq: Once | ORAL | Status: AC
  Administered 2014-03-09: 800 mg via ORAL
  Filled 2014-03-09: qty 1

## 2014-03-09 MED ORDER — DIAZEPAM 5 MG PO TABS
5.0000 mg | ORAL_TABLET | Freq: Once | ORAL | Status: AC
  Administered 2014-03-09: 5 mg via ORAL
  Filled 2014-03-09: qty 1

## 2014-03-09 NOTE — Discharge Instructions (Signed)
As discussed, your evaluation today has been largely reassuring.  But, it is important that you monitor your condition carefully, and do not hesitate to return to the ED if you develop new, or concerning changes in your condition.  The trace amounts of blood in your urine may be due to a small kidney stone, or inflammation.  Please follow-up with your physician for appropriate ongoing care.

## 2014-03-09 NOTE — ED Provider Notes (Addendum)
CSN: [id number]     Arrival date & time 03/09/14  4098 History   First MD Initiated Contact with Patient 03/09/14 [phone]     Chief Complaint  Patient presents with  . Back Pain      HPI  Patient presents with pain in the left lower back.  Pain has been intermittent, worse over the past few days.  Pain is focally just above the sacrum, nonradiating.  No relief with OTC medication, heating pad. No new weakness or loss of sensation in either lower extremity, no new incontinence. Patient does acknowledge increased activity over the past few days.  Pain is atypical for the patient.  It typically improves with standard measures. Patient is seen by pain management for similar pain. Patient has not seen her specialists in some time. Patient acknowledges a history of cystitis in the distant past.   Past Medical History  Diagnosis Date  . Endometriosis   . Hypertension   . Asthma   . Interstitial cystitis   . GERD (gastroesophageal reflux disease)   . Anxiety   . Depression   . History of DVT of lower extremity     2011  . Bladder pain   . Wears contact lenses   . Bulging lumbar disc    Past Surgical History  Procedure Laterality Date  . Carpal tunnel release Bilateral left 2000/   right 1993  . Laparoscopic cholecystectomy  2000  . Dx laparoscopy /  biopsy and fulgeration endometriosis  12-20-2009  . Cysto/  hydrodistention/ instillation therapy  01-11-2010/   1995  . Vaginal hysterectomy  04-18-2010  . Tubal ligation  02/ 2000  . Lumbar epidural injection  2012  (w/ MAC)  . Cysto with hydrodistension N/A 01/21/2014    Procedure: CYSTO WITH HYDRODISTENSION, AND INSTILLATION OF MARCAINE AND PYRIDIUM;  Surgeon: Crist Fat, MD;  [facility]: [facility];  Service: Urology;  Laterality: N/A;   Family History  Problem Relation Age of Onset  . Diabetes Mother   . Hypertension Father    History  Substance Use Topics  . Smoking status: Current Every Day Smoker  -- 0.25 packs/day    Types: Cigarettes  . Smokeless tobacco: Never Used  . Alcohol Use: No     Comment: Recovering alcoholic   OB History    Gravida Para Term Preterm AB TAB SAB Ectopic Multiple Living   3 3        3      Review of Systems  Constitutional:       Per HPI, otherwise negative  HENT:       Per HPI, otherwise negative  Respiratory:       Per HPI, otherwise negative  Cardiovascular:       Per HPI, otherwise negative  Gastrointestinal: Negative for vomiting.  Endocrine:       Negative aside from HPI  Genitourinary:       Neg aside from HPI   Musculoskeletal:       Per HPI, otherwise negative  Skin: Negative.   Neurological: Negative for syncope.      Allergies  Penicillins  Home Medications   Prior to Admission medications   Medication Sig Start Date End Date Taking? Authorizing Provider  albuterol (PROAIR HFA) 108 (90 BASE) MCG/ACT inhaler Inhale 2 puffs into the lungs every 6 (six) hours as needed for wheezing or shortness of breath (wheezing).    Yes Historical Provider, MD  beclomethasone (QVAR) 80 MCG/ACT inhaler Inhale 2 puffs into  the lungs 2 (two) times daily as needed (wheezing).    Yes Historical Provider, MD  bisacodyl (BISACODYL) 5 MG EC tablet Take 5 mg by mouth daily as needed for moderate constipation (constipation).    Yes Historical Provider, MD  diazepam (VALIUM) 10 MG tablet Place 10 mg vaginally every 6 (six) hours as needed for anxiety (anxiety).    Yes Historical Provider, MD  ketorolac (TORADOL) 10 MG tablet Take 10 mg by mouth every 6 (six) hours as needed for severe pain (pain).   Yes Historical Provider, MD  loratadine (CLARITIN) 10 MG tablet Take 10 mg by mouth daily.   Yes Historical Provider, MD  Meth-Hyo-M Bl-Na Phos-Ph Sal (URIBEL) 118 MG CAPS Take 1 capsule by mouth 4 (four) times daily as needed (frequent urination).    Yes Historical Provider, MD  methocarbamol (ROBAXIN) 500 MG tablet Take 500 mg by mouth every 12 (twelve)  hours as needed for muscle spasms (muscle spasms).   Yes Historical Provider, MD  oxyCODONE-acetaminophen (PERCOCET/ROXICET) 5-325 MG per tablet Take 2 tablets by mouth every 6 (six) hours as needed for severe pain. 12/13/13  Yes Roxy Horsemanobert Browning, PA-C  pantoprazole (PROTONIX) 40 MG tablet Take 40 mg by mouth every morning.    Yes Historical Provider, MD  traZODone (DESYREL) 100 MG tablet Take 100 mg by mouth at bedtime.   Yes Historical Provider, MD  venlafaxine XR (EFFEXOR-XR) 150 MG 24 hr capsule Take 150 mg by mouth daily with breakfast.   Yes Historical Provider, MD  doxycycline (VIBRAMYCIN) 100 MG capsule Take 100 mg by mouth at bedtime as needed (chazalion cysts).     Historical Provider, MD  KETOROLAC TROMETHAMINE PO Take 1 tablet by mouth daily as needed.    Historical Provider, MD   BP 137/99 mmHg  Pulse 102  Temp(Src) 98 F (36.7 C) (Oral)  Resp 14  SpO2 100% Physical Exam  Constitutional: She is oriented to person, place, and time. She appears well-developed and well-nourished. No distress.  HENT:  Head: Normocephalic and atraumatic.  Eyes: Conjunctivae and EOM are normal.  Cardiovascular: Normal rate and regular rhythm.   Pulmonary/Chest: Effort normal and breath sounds normal. No stridor. No respiratory distress.  Abdominal: She exhibits no distension. There is no tenderness.  Musculoskeletal: She exhibits no edema.  Patient describes pain about the LL back, but there is no appreciable TTP, nor deformity in the area. She has capacity for seemingly normal spine flexion, and moves both hips spontaneously, with no referred pain to the back.  Neurological: She is alert and oriented to person, place, and time. No cranial nerve deficit.  Skin: Skin is warm and dry.  Psychiatric: She has a normal mood and affect.  Nursing note and vitals reviewed.   ED Course  Procedures (including critical care time) Labs Review Labs Reviewed  URINALYSIS, ROUTINE W REFLEX MICROSCOPIC -  Abnormal; Notable for the following:    Hgb urine dipstick TRACE (*)    All other components within normal limits  PREGNANCY, URINE  URINE MICROSCOPIC-ADD ON    Update: The patient has +hematuria, and given her Hx, and atypical pain, she will have US eval for kidney stones.  Imaging Review [facility]  03/09/2014   CLINICAL DATA:  43 year old female with acute on chronic left back pain. Microscopic hematuria. Subsequent encounter.  EXAM: RENAL/URINARY TRACT ULTRASOUND COMPLETE  COMPARISON:  CT Abdomen and Pelvis 12/29/2013.  FINDINGS: Right Kidney:  Length: 10.7 cm. Chronic parapelvic cysts appear stable. Echogenicity within normal limits.  No mass or hydronephrosis visualized.  Left Kidney:  Length: 10.9 cm fluid density at the renal sinus appears Stable to the parapelvic cyst demonstrated previously (series 5, image 14 on that comparison). No left hydroureter identified. Echogenicity within normal limits. No mass or hydronephrosis visualized.  Bladder:  Appears normal for degree of bladder distention. The left ureteral jet is not identified with Doppler, but no distal left hydroureter is identified.  IMPRESSION: No acute findings; stable left greater than right parapelvic renal cysts as demonstrated on the delayed CT images 12/29/13 (series 5 of that exam).   Electronically Signed   By: Augusto GambleLee  Hall M.D.   On: 03/09/2014 11:33    Update: patient in no distress  MDM   Final diagnoses:  Hematuria    Patient presents with left lower back pain, is on hematuria. Given the patient's description of somewhat atypical pain for her, she had ultrasound performed which was reassuring for the absence of hydronephrosis or obvious kidney stone. Patient received analgesia here, was discharged in stable condition to follow up with her pain management clinic, urology as needed. With no f/c, n/v/d and with largely unremarkable VS there is low suspicion for occult infection or neurologic compromise.    Gerhard Munchobert  Isiac Breighner, MD 03/09/14 1146  Gerhard Munchobert Deniese Oberry, MD 03/28/14 [phone]

## 2014-03-09 NOTE — ED Notes (Signed)
Pt c/o left to mid lower back pain that started Saturday. Pt states that she has tried everything at home Advil, heating pad but not helping. Pt has arthritis and out of her pain meds and doesn't have an apt with pain management until Friday.

## 2014-09-23 ENCOUNTER — Other Ambulatory Visit: Payer: Self-pay | Admitting: Gynecology

## 2014-09-23 DIAGNOSIS — Z1231 Encounter for screening mammogram for malignant neoplasm of breast: Secondary | ICD-10-CM

## 2014-09-25 ENCOUNTER — Ambulatory Visit (HOSPITAL_COMMUNITY): Payer: Self-pay

## 2014-10-01 ENCOUNTER — Ambulatory Visit (HOSPITAL_COMMUNITY)
Admission: RE | Admit: 2014-10-01 | Discharge: 2014-10-01 | Disposition: A | Discharge status: Home / Self Care | Payer: BLUE CROSS/BLUE SHIELD | Source: Ambulatory Visit | Attending: Gynecology | Admitting: Gynecology

## 2014-10-01 DIAGNOSIS — Z1231 Encounter for screening mammogram for malignant neoplasm of breast: Secondary | ICD-10-CM | POA: Diagnosis present

## 2014-11-10 ENCOUNTER — Ambulatory Visit (INDEPENDENT_AMBULATORY_CARE_PROVIDER_SITE_OTHER): Payer: BLUE CROSS/BLUE SHIELD | Admitting: Gynecology

## 2014-11-10 ENCOUNTER — Encounter: Payer: Self-pay | Admitting: Gynecology

## 2014-11-10 VITALS — BP 122/76 | Ht 60.0 in | Wt 150.0 lb

## 2014-11-10 DIAGNOSIS — Z01419 Encounter for gynecological examination (general) (routine) without abnormal findings: Secondary | ICD-10-CM

## 2014-11-10 NOTE — Patient Instructions (Signed)
You may obtain a copy of any labs that were done today by logging onto MyChart as outlined in the instructions provided with your AVS (after visit summary). The office will not call with normal lab results but certainly if there are any significant abnormalities then we will contact you.   Health Maintenance, Female A healthy lifestyle and preventative care can promote health and wellness.  Maintain regular health, dental, and eye exams.  Eat a healthy diet. Foods like vegetables, fruits, whole grains, low-fat dairy products, and lean protein foods contain the nutrients you need without too many calories. Decrease your intake of foods high in solid fats, added sugars, and salt. Get information about a proper diet from your caregiver, if necessary.  Regular physical exercise is one of the most important things you can do for your health. Most adults should get at least 150 minutes of moderate-intensity exercise (any activity that increases your heart rate and causes you to sweat) each week. In addition, most adults need muscle-strengthening exercises on 2 or more days a week.   Maintain a healthy weight. The body mass index (BMI) is a screening tool to identify possible weight problems. It provides an estimate of body fat based on height and weight. Your caregiver can help determine your BMI, and can help you achieve or maintain a healthy weight. For adults 20 years and older:  A BMI below 18.5 is considered underweight.  A BMI of 18.5 to 24.9 is normal.  A BMI of 25 to 29.9 is considered overweight.  A BMI of 30 and above is considered obese.  Maintain normal blood lipids and cholesterol by exercising and minimizing your intake of saturated fat. Eat a balanced diet with plenty of fruits and vegetables. Blood tests for lipids and cholesterol should begin at age 61 and be repeated every 5 years. If your lipid or cholesterol levels are high, you are over 50, or you are a high risk for heart  disease, you may need your cholesterol levels checked more frequently.Ongoing high lipid and cholesterol levels should be treated with medicines if diet and exercise are not effective.  If you smoke, find out from your caregiver how to quit. If you do not use tobacco, do not start.  Lung cancer screening is recommended for adults aged 33 80 years who are at high risk for developing lung cancer because of a history of smoking. Yearly low-dose computed tomography (CT) is recommended for people who have at least a 30-pack-year history of smoking and are a current smoker or have quit within the past 15 years. A pack year of smoking is smoking an average of 1 pack of cigarettes a day for 1 year (for example: 1 pack a day for 30 years or 2 packs a day for 15 years). Yearly screening should continue until the smoker has stopped smoking for at least 15 years. Yearly screening should also be stopped for people who develop a health problem that would prevent them from having lung cancer treatment.  If you are pregnant, do not drink alcohol. If you are breastfeeding, be very cautious about drinking alcohol. If you are not pregnant and choose to drink alcohol, do not exceed 1 drink per day. One drink is considered to be 12 ounces (355 mL) of beer, 5 ounces (148 mL) of wine, or 1.5 ounces (44 mL) of liquor.  Avoid use of street drugs. Do not share needles with anyone. Ask for help if you need support or instructions about stopping  the use of drugs.  High blood pressure causes heart disease and increases the risk of stroke. Blood pressure should be checked at least every 1 to 2 years. Ongoing high blood pressure should be treated with medicines, if weight loss and exercise are not effective.  If you are 59 to 44 years old, ask your caregiver if you should take aspirin to prevent strokes.  Diabetes screening involves taking a blood sample to check your fasting blood sugar level. This should be done once every 3  years, after age 91, if you are within normal weight and without risk factors for diabetes. Testing should be considered at a younger age or be carried out more frequently if you are overweight and have at least 1 risk factor for diabetes.  Breast cancer screening is essential preventative care for women. You should practice "breast self-awareness." This means understanding the normal appearance and feel of your breasts and may include breast self-examination. Any changes detected, no matter how small, should be reported to a caregiver. Women in their 66s and 30s should have a clinical breast exam (CBE) by a caregiver as part of a regular health exam every 1 to 3 years. After age 101, women should have a CBE every year. Starting at age 100, women should consider having a mammogram (breast X-ray) every year. Women who have a family history of breast cancer should talk to their caregiver about genetic screening. Women at a high risk of breast cancer should talk to their caregiver about having an MRI and a mammogram every year.  Breast cancer gene (BRCA)-related cancer risk assessment is recommended for women who have family members with BRCA-related cancers. BRCA-related cancers include breast, ovarian, tubal, and peritoneal cancers. Having family members with these cancers may be associated with an increased risk for harmful changes (mutations) in the breast cancer genes BRCA1 and BRCA2. Results of the assessment will determine the need for genetic counseling and BRCA1 and BRCA2 testing.  The Pap test is a screening test for cervical cancer. Women should have a Pap test starting at age 57. Between ages 25 and 35, Pap tests should be repeated every 2 years. Beginning at age 37, you should have a Pap test every 3 years as long as the past 3 Pap tests have been normal. If you had a hysterectomy for a problem that was not cancer or a condition that could lead to cancer, then you no longer need Pap tests. If you are  between ages 50 and 76, and you have had normal Pap tests going back 10 years, you no longer need Pap tests. If you have had past treatment for cervical cancer or a condition that could lead to cancer, you need Pap tests and screening for cancer for at least 20 years after your treatment. If Pap tests have been discontinued, risk factors (such as a new sexual partner) need to be reassessed to determine if screening should be resumed. Some women have medical problems that increase the chance of getting cervical cancer. In these cases, your caregiver may recommend more frequent screening and Pap tests.  The human papillomavirus (HPV) test is an additional test that may be used for cervical cancer screening. The HPV test looks for the virus that can cause the cell changes on the cervix. The cells collected during the Pap test can be tested for HPV. The HPV test could be used to screen women aged 44 years and older, and should be used in women of any age  who have unclear Pap test results. After the age of 30, women should have HPV testing at the same frequency as a Pap test.  Colorectal cancer can be detected and often prevented. Most routine colorectal cancer screening begins at the age of 50 and continues through age 75. However, your caregiver may recommend screening at an earlier age if you have risk factors for colon cancer. On a yearly basis, your caregiver may provide home test kits to check for hidden blood in the stool. Use of a small camera at the end of a tube, to directly examine the colon (sigmoidoscopy or colonoscopy), can detect the earliest forms of colorectal cancer. Talk to your caregiver about this at age 50, when routine screening begins. Direct examination of the colon should be repeated every 5 to 10 years through age 75, unless early forms of pre-cancerous polyps or small growths are found.  Hepatitis C blood testing is recommended for all people born from 1945 through 1965 and any  individual with known risks for hepatitis C.  Practice safe sex. Use condoms and avoid high-risk sexual practices to reduce the spread of sexually transmitted infections (STIs). Sexually active women aged 25 and younger should be checked for Chlamydia, which is a common sexually transmitted infection. Older women with new or multiple partners should also be tested for Chlamydia. Testing for other STIs is recommended if you are sexually active and at increased risk.  Osteoporosis is a disease in which the bones lose minerals and strength with aging. This can result in serious bone fractures. The risk of osteoporosis can be identified using a bone density scan. Women ages 65 and over and women at risk for fractures or osteoporosis should discuss screening with their caregivers. Ask your caregiver whether you should be taking a calcium supplement or vitamin D to reduce the rate of osteoporosis.  Menopause can be associated with physical symptoms and risks. Hormone replacement therapy is available to decrease symptoms and risks. You should talk to your caregiver about whether hormone replacement therapy is right for you.  Use sunscreen. Apply sunscreen liberally and repeatedly throughout the day. You should seek shade when your shadow is shorter than you. Protect yourself by wearing long sleeves, pants, a wide-brimmed hat, and sunglasses year round, whenever you are outdoors.  Notify your caregiver of new moles or changes in moles, especially if there is a change in shape or color. Also notify your caregiver if a mole is larger than the size of a pencil eraser.  Stay current with your immunizations. Document Released: 11/07/2010 Document Revised: 08/19/2012 Document Reviewed: 11/07/2010 [employer] Patient Information 2014 [employer].   

## 2014-11-10 NOTE — Progress Notes (Signed)
Fritzi MandesSherry M Batrez October 28, 1970 [id number]        44 y.o.  G3P3 for annual exam.  Doing well without complaints.  Past medical history,surgical history, problem list, medications, allergies, family history and social history were all reviewed and documented as reviewed in the EPIC chart.  ROS:  Performed with pertinent positives and negatives included in the history, assessment and plan.   Additional significant findings :  none   Exam: [facility] personassistant Filed Vitals:   11/10/14 1527  BP: 122/76  Height: 5' (1.524 m)  Weight: 150 lb (68.04 kg)   General appearance:  Normal affect, orientation and appearance. Skin: Grossly normal HEENT: Without gross lesions.  No cervical or supraclavicular adenopathy. Thyroid normal.  Lungs:  Clear without wheezing, rales or rhonchi Cardiac: RR, without RMG Abdominal:  Soft, nontender, without masses, guarding, rebound, organomegaly or hernia Breasts:  Examined lying and sitting without masses, retractions, discharge or axillary adenopathy. Pelvic:  Ext/BUS/vagina normal.  Adnexa  Without masses or tenderness    Anus and perineum  Normal   Rectovaginal  Normal sphincter tone without palpated masses or tenderness.    Assessment/Plan:  44 y.o. G3P3 female for annual exam status post TVH 2011 for endometriosis, pelvic pain, dysmenorrhea. Doing well without significant symptoms of hot flashes sweats or other perimenopausal symptoms.  1. Pap smear 2015. No Pap smear done today.  No history of abnormal Pap smears previously. Options to stop screening altogether as she is status post hysterectomy for benign indications versus less frequent screening intervals reviewed. Will readdress on annual basis. 2. Mammography 09/2014. Continue with annual mammography. SBE monthly reviewed. 3. Health maintenance. Baseline CBC comprehensive metabolic panel lipid profile urinalysis ordered. Follow up in one year, sooner as needed.   Dara LordsFONTAINE,Maryon Kemnitz P MD, 3:49 PM  11/10/2014

## 2014-11-11 LAB — COMPREHENSIVE METABOLIC PANEL
ALT: 15 U/L (ref 0–35)
AST: 15 U/L (ref 0–37)
Albumin: 4 g/dL (ref 3.5–5.2)
Alkaline Phosphatase: 40 U/L (ref 39–117)
BUN: 26 mg/dL — ABNORMAL HIGH (ref 6–23)
CALCIUM: 9.5 mg/dL (ref 8.4–10.5)
CHLORIDE: 103 meq/L (ref 96–112)
CO2: 25 mEq/L (ref 19–32)
CREATININE: 0.67 mg/dL (ref 0.50–1.10)
Glucose, Bld: 86 mg/dL (ref 70–99)
Potassium: 4 mEq/L (ref 3.5–5.3)
Sodium: 136 mEq/L (ref 135–145)
Total Bilirubin: 0.3 mg/dL (ref 0.2–1.2)
Total Protein: 6.4 g/dL (ref 6.0–8.3)

## 2014-11-11 LAB — CBC WITH DIFFERENTIAL/PLATELET
Basophils Absolute: 0 10*3/uL (ref 0.0–0.1)
Basophils Relative: 0 % (ref 0–1)
EOS ABS: 0.1 10*3/uL (ref 0.0–0.7)
EOS PCT: 1 % (ref 0–5)
HCT: 39.9 % (ref 36.0–46.0)
Hemoglobin: 13.5 g/dL (ref 12.0–15.0)
Lymphocytes Relative: 25 % (ref 12–46)
Lymphs Abs: 2 10*3/uL (ref 0.7–4.0)
MCH: 30.7 pg (ref 26.0–34.0)
MCHC: 33.8 g/dL (ref 30.0–36.0)
MCV: 90.7 fL (ref 78.0–100.0)
MONOS PCT: 8 % (ref 3–12)
MPV: 9.5 fL (ref 8.6–12.4)
Monocytes Absolute: 0.6 10*3/uL (ref 0.1–1.0)
Neutro Abs: 5.3 10*3/uL (ref 1.7–7.7)
Neutrophils Relative %: 66 % (ref 43–77)
PLATELETS: 219 10*3/uL (ref 150–400)
RBC: 4.4 MIL/uL (ref 3.87–5.11)
RDW: 14.5 % (ref 11.5–15.5)
WBC: 8 10*3/uL (ref 4.0–10.5)

## 2014-11-11 LAB — URINALYSIS W MICROSCOPIC + REFLEX CULTURE
Bilirubin Urine: NEGATIVE
CASTS: NONE SEEN
CRYSTALS: NONE SEEN
Glucose, UA: NEGATIVE mg/dL
Hgb urine dipstick: NEGATIVE
KETONES UR: NEGATIVE mg/dL
LEUKOCYTES UA: NEGATIVE
Nitrite: NEGATIVE
Protein, ur: NEGATIVE mg/dL
Specific Gravity, Urine: 1.013 (ref 1.005–1.030)
Squamous Epithelial / LPF: NONE SEEN
UROBILINOGEN UA: 0.2 mg/dL (ref 0.0–1.0)
pH: 6.5 (ref 5.0–8.0)

## 2014-11-11 LAB — LIPID PANEL
Cholesterol: 141 mg/dL (ref 0–200)
HDL: 62 mg/dL (ref >=46)
LDL CALC: 64 mg/dL (ref 0–99)
TRIGLYCERIDES: 77 mg/dL (ref <150)
Total CHOL/HDL Ratio: 2.3 Ratio
VLDL: 15 mg/dL (ref 0–40)

## 2018-05-22 ENCOUNTER — Encounter: Payer: Self-pay | Admitting: Emergency Medicine

## 2018-05-22 DIAGNOSIS — F3160 Bipolar disorder, current episode mixed, unspecified: Secondary | ICD-10-CM

## 2018-07-10 ENCOUNTER — Ambulatory Visit: Payer: BLUE CROSS/BLUE SHIELD | Admitting: Psychiatry

## 2018-07-10 ENCOUNTER — Encounter: Payer: Self-pay | Admitting: Psychiatry

## 2018-07-10 DIAGNOSIS — F3181 Bipolar II disorder: Secondary | ICD-10-CM | POA: Diagnosis not present

## 2018-07-10 DIAGNOSIS — Z79899 Other long term (current) drug therapy: Secondary | ICD-10-CM

## 2018-07-10 DIAGNOSIS — Z0189 Encounter for other specified special examinations: Secondary | ICD-10-CM

## 2018-07-10 NOTE — Progress Notes (Signed)
Victoria Camacho [id number] 02-19-1971 48 y.o.  Subjective:   Patient ID:  Victoria Camacho is a 48 y.o. (DOB [birth year]) female.  Chief Complaint:  Chief Complaint  Patient presents with  . Follow-up    Medication Management   Last seen June. HPI Victoria Camacho presents to the office today for follow-up of bipolar.  Last med change March 2017.  One spell of depression November a few days and resolved.  No other mood problems since here.  Patient reports stable mood and denies depressed or irritable moods.  Patient denies any recent difficulty with anxiety.  Patient denies difficulty with sleep initiation or maintenance. Denies appetite disturbance.  Patient reports that energy and motivation have been good.  Patient denies any difficulty with concentration.  Patient denies any suicidal ideation.  Past Psychiatric Medication Trials: Venlafaxine 225, buspirone, Wellbutrin, trazodone, imipramine, Ambien, Xanax, lamotrigine, Lunesta, citalopram  Review of Systems:  Review of Systems  Neurological: Negative for tremors and weakness.  Psychiatric/Behavioral: Negative for agitation, behavioral problems, confusion, decreased concentration, hallucinations, self-injury, sleep disturbance and suicidal ideas. The patient is not nervous/anxious and is not hyperactive.     Medications: I have reviewed the patient's current medications.  Current Outpatient Medications  Medication Sig Dispense Refill  . albuterol (PROAIR HFA) 108 (90 Base) MCG/ACT inhaler Inhale 2 puffs into the lungs every 6 (six) hours as needed for wheezing or shortness of breath (wheezing).     . ARIPiprazole (ABILIFY) 15 MG tablet Take 15 mg by mouth daily.    Marland Kitchen buPROPion (WELLBUTRIN SR) 100 MG 12 hr tablet Take 200 mg by mouth every morning.     Marland Kitchen omeprazole (PRILOSEC) 20 MG capsule     . traZODone (DESYREL) 150 MG tablet Take by mouth at bedtime.     No current facility-administered medications for this  visit.     Medication Side Effects: None  Allergies:  Allergies  Allergen Reactions  . Penicillins Rash    Past Medical History:  Diagnosis Date  . Anxiety   . Arthritis   . Asthma   . Bladder pain   . Bulging lumbar disc   . Depression   . Endometriosis   . GERD (gastroesophageal reflux disease)   . History of DVT of lower extremity    2011  . Hypertension   . Interstitial cystitis   . Wears contact lenses     Family History  Problem Relation Age of Onset  . Diabetes Mother   . Hypertension Father     Social History   Socioeconomic History  . Marital status: Legally Separated    Spouse name: Not on file  . Number of children: Not on file  . Years of education: Not on file  . Highest education level: Not on file  Occupational History  . Not on file  Social Needs  . Financial resource strain: Not on file  . Food insecurity:    Worry: Not on file    Inability: Not on file  . Transportation needs:    Medical: Not on file    Non-medical: Not on file  Tobacco Use  . Smoking status: Former Smoker    Packs/day: 0.25    Types: Cigarettes  . Smokeless tobacco: Never Used  Substance and Sexual Activity  . Alcohol use: No    Alcohol/week: 0.0 standard drinks    Comment: Recovering alcoholic  . Drug use: No  . Sexual activity: Not Currently    Birth control/protection: Surgical  Comment: 1st intercourse 63 yo-2 partners  Lifestyle  . Physical activity:    Days per week: Not on file    Minutes per session: Not on file  . Stress: Not on file  Relationships  . Social connections:    Talks on phone: Not on file    Gets together: Not on file    Attends religious service: Not on file    Active member of club or organization: Not on file    Attends meetings of clubs or organizations: Not on file    Relationship status: Not on file  . Intimate partner violence:    Fear of current or ex partner: Not on file    Emotionally abused: Not on file    Physically  abused: Not on file    Forced sexual activity: Not on file  Other Topics Concern  . Not on file  Social History Narrative  . Not on file    Past Medical History, Surgical history, Social history, and Family history were reviewed and updated as appropriate.   Please see review of systems for further details on the patient's review from today.   Objective:   Physical Exam:  There were no vitals taken for this visit.  Physical Exam  Lab Review:     Component Value Date/Time   NA 136 11/10/2014 1553   K 4.0 11/10/2014 1553   CL 103 11/10/2014 1553   CO2 25 11/10/2014 1553   GLUCOSE 86 11/10/2014 1553   BUN 26 (H) 11/10/2014 1553   CREATININE 0.67 11/10/2014 1553   CALCIUM 9.5 11/10/2014 1553   PROT 6.4 11/10/2014 1553   ALBUMIN 4.0 11/10/2014 1553   AST 15 11/10/2014 1553   ALT 15 11/10/2014 1553   ALKPHOS 40 11/10/2014 1553   BILITOT 0.3 11/10/2014 1553   GFRNONAA >90 12/12/2013 2230   GFRAA >90 12/12/2013 2230       Component Value Date/Time   WBC 8.0 11/10/2014 1553   RBC 4.40 11/10/2014 1553   HGB 13.5 11/10/2014 1553   HCT 39.9 11/10/2014 1553   PLT 219 11/10/2014 1553   MCV 90.7 11/10/2014 1553   MCH 30.7 11/10/2014 1553   MCHC 33.8 11/10/2014 1553   RDW 14.5 11/10/2014 1553   LYMPHSABS 2.0 11/10/2014 1553   MONOABS 0.6 11/10/2014 1553   EOSABS 0.1 11/10/2014 1553   BASOSABS 0.0 11/10/2014 1553    No results found for: POCLITH, LITHIUM   No results found for: PHENYTOIN, PHENOBARB, VALPROATE, CBMZ   .res Assessment: Plan:    Bipolar II disorder (HCC) - Plan: Glucose, fasting, Hemoglobin A1c, Lipid panel  Assessment of effects of psychotropic drug in patient at risk for metabolic syndrome - Plan: Glucose, fasting, Hemoglobin A1c, Lipid panel   History of opioid and alcohol dependence in remission  Bipolar disorder in remission.  History of polydrug dependence in remission as well.  Good response to medications.  We will not prescribe controlled  substances secondary history of dependence  Discussed potential metabolic side effects associated with atypical antipsychotics, as well as potential risk for movement side effects. Advised pt to contact office if movement side effects occur.  Recommend check labs.  \No med changes indicated FU 9 mo bc stable for 3 years  Meredith Staggers, MD, DFAPA   Please see After Visit Summary for patient specific instructions.  No future appointments.  Orders Placed This Encounter  Procedures  . Glucose, fasting  . Hemoglobin A1c  . Lipid panel      -------------------------------

## 2018-07-12 LAB — LIPID PANEL
Cholesterol: 170 mg/dL (ref <200)
HDL: 59 mg/dL (ref >=50)
LDL Cholesterol (Calc): 92 mg/dL (calc)
Non-HDL Cholesterol (Calc): 111 mg/dL (calc) (ref <130)
Total CHOL/HDL Ratio: 2.9 (calc) (ref <5.0)
Triglycerides: 94 mg/dL (ref <150)

## 2018-07-12 LAB — HEMOGLOBIN A1C
EAG (MMOL/L): 5.5 (calc)
Hgb A1c MFr Bld: 5.1 % of total Hgb (ref <5.7)
MEAN PLASMA GLUCOSE: 100 (calc)

## 2018-07-12 LAB — GLUCOSE, FASTING: Glucose, Plasma: 83 mg/dL (ref 65–99)

## 2018-07-15 NOTE — Progress Notes (Signed)
Called pt. And she verbalized understanding of labs and that there will be no changes at this time.

## 2018-08-22 ENCOUNTER — Other Ambulatory Visit: Payer: Self-pay

## 2018-08-22 MED ORDER — TRAZODONE HCL 150 MG PO TABS: 150.0000 mg | ORAL_TABLET | Freq: Every day | ORAL | 9 refills | Status: DC

## 2018-09-23 ENCOUNTER — Other Ambulatory Visit: Payer: Self-pay

## 2018-09-23 MED ORDER — BUPROPION HCL ER (SR) 100 MG PO TB12: 200.0000 mg | ORAL_TABLET | ORAL | 9 refills | Status: DC

## 2018-11-19 ENCOUNTER — Other Ambulatory Visit: Payer: Self-pay

## 2018-11-19 MED ORDER — ARIPIPRAZOLE 15 MG PO TABS: 15.0000 mg | ORAL_TABLET | Freq: Every day | ORAL | 0 refills | Status: DC

## 2018-11-29 ENCOUNTER — Other Ambulatory Visit: Payer: Self-pay | Admitting: Psychiatry

## 2019-02-17 ENCOUNTER — Other Ambulatory Visit: Payer: Self-pay | Admitting: Psychiatry

## 2019-02-22 ENCOUNTER — Other Ambulatory Visit: Payer: Self-pay | Admitting: Psychiatry

## 2019-02-24 DIAGNOSIS — F4312 Post-traumatic stress disorder, chronic: Secondary | ICD-10-CM | POA: Diagnosis not present

## 2019-02-26 ENCOUNTER — Other Ambulatory Visit: Payer: Self-pay

## 2019-02-26 ENCOUNTER — Ambulatory Visit (INDEPENDENT_AMBULATORY_CARE_PROVIDER_SITE_OTHER): Payer: BC Managed Care – PPO | Admitting: Psychiatry

## 2019-02-26 ENCOUNTER — Encounter: Payer: Self-pay | Admitting: Psychiatry

## 2019-02-26 VITALS — BP 128/91 | HR 95

## 2019-02-26 DIAGNOSIS — F4310 Post-traumatic stress disorder, unspecified: Secondary | ICD-10-CM

## 2019-02-26 DIAGNOSIS — F3181 Bipolar II disorder: Secondary | ICD-10-CM

## 2019-02-26 MED ORDER — CLONIDINE HCL 0.1 MG PO TABS: ORAL_TABLET | ORAL | 1 refills | Status: DC
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
## 2019-03-12 DIAGNOSIS — F4320 Adjustment disorder, unspecified: Secondary | ICD-10-CM | POA: Diagnosis not present

## 2019-03-12 DIAGNOSIS — F329 Major depressive disorder, single episode, unspecified: Secondary | ICD-10-CM | POA: Diagnosis not present

## 2019-03-12 DIAGNOSIS — F411 Generalized anxiety disorder: Secondary | ICD-10-CM | POA: Diagnosis not present

## 2019-03-13 ENCOUNTER — Telehealth: Payer: Self-pay | Admitting: Psychiatry

## 2019-03-13 NOTE — Telephone Encounter (Signed)
Victoria Camacho called  to report that the new medicine is working well but now the sleep medicine is not working as good.  She can get to sleep but wakes up and can't go back to sleep.  Please call to discuss.  Next appt 11/25.

## 2019-03-14 NOTE — Telephone Encounter (Signed)
Have her increase trazodone to 1-1/2-2 of the 150 mg tablets and call us back next week if that does not work and we may consider switching it out to mirtazapine.  I am surprised she is saying the clonidine is interfering with her sleep that is unusual.

## 2019-03-14 NOTE — Telephone Encounter (Signed)
Pt. Made aware and will call back if no improvement.

## 2019-03-17 ENCOUNTER — Other Ambulatory Visit: Payer: Self-pay | Admitting: Psychiatry

## 2019-03-17 MED ORDER — MIRTAZAPINE 15 MG PO TABS: 15.0000 mg | ORAL_TABLET | Freq: Every day | ORAL | 0 refills | Status: DC

## 2019-03-17 NOTE — Telephone Encounter (Signed)
Victoria Camacho ;called to report that she increased her trazodone as instructed, but it didn't help.  Please call with other options.

## 2019-03-17 NOTE — Telephone Encounter (Signed)
Have her stop trazodone and start mirtazapine 15 mg 1 nightly.  Prescription was sent in

## 2019-03-17 NOTE — Telephone Encounter (Signed)
Patient notified to stop Trazodone and start Mirtazapine. Instructed to call back with further concerns

## 2019-03-20 ENCOUNTER — Other Ambulatory Visit: Payer: Self-pay | Admitting: Psychiatry

## 2019-03-20 DIAGNOSIS — F4310 Post-traumatic stress disorder, unspecified: Secondary | ICD-10-CM

## 2019-03-20 NOTE — Telephone Encounter (Signed)
Has 1 refill on file prior to apt

## 2019-03-24 DIAGNOSIS — F329 Major depressive disorder, single episode, unspecified: Secondary | ICD-10-CM | POA: Diagnosis not present

## 2019-03-24 DIAGNOSIS — F411 Generalized anxiety disorder: Secondary | ICD-10-CM | POA: Diagnosis not present

## 2019-03-24 DIAGNOSIS — F4320 Adjustment disorder, unspecified: Secondary | ICD-10-CM | POA: Diagnosis not present

## 2019-04-02 ENCOUNTER — Ambulatory Visit (INDEPENDENT_AMBULATORY_CARE_PROVIDER_SITE_OTHER): Payer: BC Managed Care – PPO | Admitting: Psychiatry

## 2019-04-02 ENCOUNTER — Other Ambulatory Visit: Payer: Self-pay

## 2019-04-02 ENCOUNTER — Encounter: Payer: Self-pay | Admitting: Psychiatry

## 2019-04-02 DIAGNOSIS — F5105 Insomnia due to other mental disorder: Secondary | ICD-10-CM | POA: Diagnosis not present

## 2019-04-02 DIAGNOSIS — F4310 Post-traumatic stress disorder, unspecified: Secondary | ICD-10-CM | POA: Diagnosis not present

## 2019-04-02 DIAGNOSIS — F3181 Bipolar II disorder: Secondary | ICD-10-CM | POA: Diagnosis not present
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
## 2019-04-07 DIAGNOSIS — F411 Generalized anxiety disorder: Secondary | ICD-10-CM | POA: Diagnosis not present

## 2019-04-07 DIAGNOSIS — F4320 Adjustment disorder, unspecified: Secondary | ICD-10-CM | POA: Diagnosis not present

## 2019-04-07 DIAGNOSIS — F329 Major depressive disorder, single episode, unspecified: Secondary | ICD-10-CM | POA: Diagnosis not present

## 2019-04-08 ENCOUNTER — Ambulatory Visit: Payer: BLUE CROSS/BLUE SHIELD | Admitting: Psychiatry

## 2019-04-08 ENCOUNTER — Other Ambulatory Visit: Payer: Self-pay | Admitting: Psychiatry

## 2019-04-21 ENCOUNTER — Other Ambulatory Visit: Payer: Self-pay | Admitting: Psychiatry

## 2019-04-21 DIAGNOSIS — F4310 Post-traumatic stress disorder, unspecified: Secondary | ICD-10-CM

## 2019-05-20 ENCOUNTER — Other Ambulatory Visit: Payer: Self-pay | Admitting: Psychiatry

## 2019-07-13 ENCOUNTER — Other Ambulatory Visit: Payer: Self-pay | Admitting: Psychiatry

## 2019-07-13 DIAGNOSIS — F4310 Post-traumatic stress disorder, unspecified: Secondary | ICD-10-CM

## 2019-07-31 ENCOUNTER — Encounter: Payer: Self-pay | Admitting: Psychiatry

## 2019-07-31 ENCOUNTER — Ambulatory Visit (INDEPENDENT_AMBULATORY_CARE_PROVIDER_SITE_OTHER): Payer: BC Managed Care – PPO | Admitting: Psychiatry

## 2019-07-31 DIAGNOSIS — F4310 Post-traumatic stress disorder, unspecified: Secondary | ICD-10-CM

## 2019-07-31 DIAGNOSIS — F5105 Insomnia due to other mental disorder: Secondary | ICD-10-CM

## 2019-07-31 DIAGNOSIS — F3181 Bipolar II disorder: Secondary | ICD-10-CM | POA: Diagnosis not present
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
## 2019-08-15 ENCOUNTER — Other Ambulatory Visit: Payer: Self-pay | Admitting: Psychiatry

## 2019-10-03 ENCOUNTER — Other Ambulatory Visit: Payer: Self-pay | Admitting: Psychiatry

## 2019-10-18 ENCOUNTER — Ambulatory Visit
Admission: EM | Admit: 2019-10-18 | Discharge: 2019-10-18 | Disposition: A | Discharge status: Home / Self Care | Payer: BC Managed Care – PPO | Attending: Physician Assistant | Admitting: Physician Assistant

## 2019-10-18 DIAGNOSIS — J4521 Mild intermittent asthma with (acute) exacerbation: Secondary | ICD-10-CM

## 2019-10-18 MED ORDER — ALBUTEROL SULFATE HFA 108 (90 BASE) MCG/ACT IN AERS
2.0000 | INHALATION_SPRAY | Freq: Once | RESPIRATORY_TRACT | Status: AC
  Administered 2019-10-18: 2 via RESPIRATORY_TRACT

## 2019-10-18 NOTE — Discharge Instructions (Signed)
He had wheezing on her lung exam, this cleared up after using albuterol inhaler.  Continue albuterol as needed every 4-6 hours.  Add allergy medicine such as Zyrtec, Flonase in case seasonal changes is causing symptoms.  If worsening symptoms, follow-up for reevaluation.  If noticing more frequent use of albuterol, follow-up with PCP for further evaluation and management of asthma.  If having significant shortness of breath not relieved by albuterol, chest pain, weakness, dizziness, go to the emergency department for further evaluation.

## 2019-10-18 NOTE — ED Provider Notes (Signed)
[facility]    CSN: [id number] Arrival date & time: 10/18/19  6226      History   Chief Complaint Chief Complaint  Patient presents with  . Cough  . Wheezing    HPI Victoria Camacho is a 49 y.o. female.   49 year old female comes in for 2 day of URI symptoms. Cough, wheezing. Denies rhinorrhea, nasal congestion, sore throat. Denies fever, chills, body aches. Denies abdominal pain, nausea, vomiting, diarrhea. Denies shortness of breath, loss of taste/smell. Former smoker. Steam shower at night. Does not have inhaler. COVID vaccine 08/2019.      Past Medical History:  Diagnosis Date  . Anxiety   . Arthritis   . Asthma   . Bladder pain   . Bulging lumbar disc   . Depression   . Endometriosis   . GERD (gastroesophageal reflux disease)   . History of DVT of lower extremity    2011  . Hypertension   . Interstitial cystitis   . Wears contact lenses     Patient Active Problem List   Diagnosis Date Noted  . Mixed bipolar I disorder ([place]) 05/22/2018  . Smoker 10/16/2013  . Alcoholism in recovery [facility]) 10/16/2013    Past Surgical History:  Procedure Laterality Date  . CARPAL TUNNEL RELEASE Bilateral left 2000/   right 1993  . CYSTO WITH HYDRODISTENSION N/A 01/21/2014   Procedure: CYSTO WITH HYDRODISTENSION, AND INSTILLATION OF MARCAINE AND PYRIDIUM;  Surgeon: Ardis Hughs, MD;  Location: [facility];  Service: Urology;  Laterality: N/A;  . CYSTO/  HYDRODISTENTION/ INSTILLATION THERAPY  01-11-2010/   1995  . DX LAPAROSCOPY /  BIOPSY AND FULGERATION ENDOMETRIOSIS  12-20-2009  . LAPAROSCOPIC CHOLECYSTECTOMY  2000  . LUMBAR EPIDURAL INJECTION  2012  (w/ MAC)  . TUBAL LIGATION  02/ 2000  . VAGINAL HYSTERECTOMY  04-18-2010    OB History    Gravida  3   Para  3   Term      Preterm      AB      Living  3     SAB      TAB      Ectopic      Multiple      Live Births               Home Medications    Prior to  Admission medications   Medication Sig Start Date End Date Taking? Authorizing Provider  albuterol (PROAIR HFA) 108 (90 Base) MCG/ACT inhaler Inhale 2 puffs into the lungs every 6 (six) hours as needed for wheezing or shortness of breath (wheezing).     [provider]  ARIPiprazole (ABILIFY) 15 MG tablet TAKE 1 TABLET BY MOUTH EVERY DAY 08/15/19   Cottle, Billey Co., MD  buPROPion (WELLBUTRIN SR) 100 MG 12 hr tablet TAKE 2 TABLETS (200 MG TOTAL) BY MOUTH EVERY MORNING. 02/17/19   Cottle, Billey Co., MD  cloNIDine (CATAPRES) 0.1 MG tablet TAKE 1/2 TABLET IN AM AND 1 TAB AT NIGHT Patient not taking: Reported on 07/31/2019 07/14/19   Purnell Shoemaker., MD  mirtazapine (REMERON) 15 MG tablet TAKE 1 TABLET BY MOUTH EVERYDAY AT BEDTIME 10/03/19   Cottle, Billey Co., MD  omeprazole (PRILOSEC) 20 MG capsule  04/02/18   [provider]    Family History Family History  Problem Relation Age of Onset  . Diabetes Mother   . Hypertension Father     Social  History Social History   Tobacco Use  . Smoking status: Former Smoker    Packs/day: 0.25    Types: Cigarettes  . Smokeless tobacco: Never Used  Substance Use Topics  . Alcohol use: No    Alcohol/week: 0.0 standard drinks    Comment: Recovering alcoholic  . Drug use: No     Allergies   Penicillins   Review of Systems Review of Systems  Reason unable to perform ROS: See HPI as above.     Physical Exam Triage Vital Signs ED Triage Vitals  Enc Vitals Group     BP 10/18/19 1007 129/87     Pulse Rate 10/18/19 1007 (!) 112     Resp 10/18/19 1007 16     Temp 10/18/19 1007 98.7 F (37.1 C)     Temp Source 10/18/19 1007 Oral     SpO2 10/18/19 1007 97 %     Weight --      Height --      Head Circumference --      Peak Flow --      Pain Score 10/18/19 1008 0     Pain Loc --      Pain Edu? --      Excl. in [place]? --    No data found.  Updated Vital Signs BP 129/87 (BP Location: Left Arm)   Pulse (!) 112    Temp 98.7 F (37.1 C) (Oral)   Resp 16   SpO2 97%   Visual Acuity Right Eye Distance:   Left Eye Distance:   Bilateral Distance:    Right Eye Near:   Left Eye Near:    Bilateral Near:     Physical Exam Constitutional:      General: She is not in acute distress.    Appearance: Normal appearance. She is not ill-appearing, toxic-appearing or diaphoretic.  HENT:     Head: Normocephalic and atraumatic.     Mouth/Throat:     Mouth: Mucous membranes are moist.     Pharynx: Oropharynx is clear. Uvula midline.  Cardiovascular:     Rate and Rhythm: Normal rate and regular rhythm.     Heart sounds: Normal heart sounds. No murmur heard.  No friction rub. No gallop.   Pulmonary:     Effort: Pulmonary effort is normal. No accessory muscle usage, prolonged expiration, respiratory distress or retractions.     Comments: Lungs with end expiratory wheezing throughout, decreased air movement.  Albuterol 2 puffs x1: Improved air movement, still with end expiratory wheezing in the periphery.  Albuterol 2 puffs x2: Good air movement, lungs clear to auscultation bilaterally without adventitious lung sounds. Musculoskeletal:     Cervical back: Normal range of motion and neck supple.  Neurological:     General: No focal deficit present.     Mental Status: She is alert and oriented to person, place, and time.      UC Treatments / Results  Labs (all labs ordered are listed, but only abnormal results are displayed) Labs Reviewed - No data to display  EKG   Radiology No results found.  Procedures Procedures (including critical care time)  Medications Ordered in UC Medications  albuterol (VENTOLIN HFA) 108 (90 Base) MCG/ACT inhaler 2 puff (has no administration in time range)    Initial Impression / Assessment and Plan / UC Course  I have reviewed the triage vital signs and the nursing notes.  Pertinent labs & imaging results that were available during my care of the patient  were  reviewed by me and considered in my medical decision making (see chart for details).    Patient afebrile, nontoxic.  No hypoxia, tachypnea.  Tachycardia resolved on exam.  After 4 puffs of albuterol, lungs clear to auscultation bilaterally without adventitious lung sounds.  History and exam consistent with asthma exacerbation, at this time does not require prednisone use.  Will continue albuterol and monitor symptoms.  Return precautions given.  Patient expresses understanding and agrees to plan.  Final Clinical Impressions(s) / UC Diagnoses   Final diagnoses:  Mild intermittent asthma with acute exacerbation    ED Prescriptions    None     PDMP not reviewed this encounter.   Ok Edwards, PA-C 10/18/19 1049

## 2019-10-18 NOTE — ED Triage Notes (Signed)
Patient is here for evaluation of productive cough (yellow) and wheezing x 2 days.  She reports a h/o asthma, denies having an inhaler at current.

## 2020-02-02 ENCOUNTER — Encounter: Payer: Self-pay | Admitting: Psychiatry

## 2020-02-02 ENCOUNTER — Other Ambulatory Visit: Payer: Self-pay

## 2020-02-02 ENCOUNTER — Ambulatory Visit (INDEPENDENT_AMBULATORY_CARE_PROVIDER_SITE_OTHER): Payer: BC Managed Care – PPO | Admitting: Psychiatry

## 2020-02-02 DIAGNOSIS — F4310 Post-traumatic stress disorder, unspecified: Secondary | ICD-10-CM | POA: Diagnosis not present

## 2020-02-02 DIAGNOSIS — F3181 Bipolar II disorder: Secondary | ICD-10-CM

## 2020-02-02 DIAGNOSIS — F5105 Insomnia due to other mental disorder: Secondary | ICD-10-CM
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
## 2020-02-09 ENCOUNTER — Other Ambulatory Visit: Payer: Self-pay | Admitting: Psychiatry

## 2020-02-09 NOTE — Telephone Encounter (Signed)
Please review

## 2020-03-10 DIAGNOSIS — Z114 Encounter for screening for human immunodeficiency virus [HIV]: Secondary | ICD-10-CM | POA: Diagnosis not present

## 2020-03-10 DIAGNOSIS — Z1159 Encounter for screening for other viral diseases: Secondary | ICD-10-CM | POA: Diagnosis not present

## 2020-03-10 DIAGNOSIS — H00011 Hordeolum externum right upper eyelid: Secondary | ICD-10-CM | POA: Diagnosis not present

## 2020-03-10 DIAGNOSIS — Z23 Encounter for immunization: Secondary | ICD-10-CM | POA: Diagnosis not present

## 2020-03-23 ENCOUNTER — Other Ambulatory Visit: Payer: Self-pay | Admitting: Psychiatry

## 2020-04-05 DIAGNOSIS — Z6834 Body mass index (BMI) 34.0-34.9, adult: Secondary | ICD-10-CM | POA: Diagnosis not present

## 2020-04-05 DIAGNOSIS — U071 COVID-19: Secondary | ICD-10-CM | POA: Diagnosis not present

## 2020-05-10 ENCOUNTER — Other Ambulatory Visit: Payer: Self-pay | Admitting: Psychiatry

## 2020-05-17 DIAGNOSIS — H00024 Hordeolum internum left upper eyelid: Secondary | ICD-10-CM | POA: Diagnosis not present

## 2020-05-22 DIAGNOSIS — H04123 Dry eye syndrome of bilateral lacrimal glands: Secondary | ICD-10-CM | POA: Diagnosis not present

## 2020-06-08 DIAGNOSIS — H10013 Acute follicular conjunctivitis, bilateral: Secondary | ICD-10-CM | POA: Diagnosis not present

## 2020-06-26 DIAGNOSIS — H10013 Acute follicular conjunctivitis, bilateral: Secondary | ICD-10-CM | POA: Diagnosis not present

## 2020-07-07 DIAGNOSIS — M1711 Unilateral primary osteoarthritis, right knee: Secondary | ICD-10-CM | POA: Diagnosis not present

## 2020-07-07 DIAGNOSIS — M1712 Unilateral primary osteoarthritis, left knee: Secondary | ICD-10-CM | POA: Diagnosis not present

## 2020-07-10 DIAGNOSIS — H04123 Dry eye syndrome of bilateral lacrimal glands: Secondary | ICD-10-CM | POA: Diagnosis not present

## 2020-08-01 ENCOUNTER — Other Ambulatory Visit: Payer: Self-pay | Admitting: Psychiatry

## 2020-08-02 ENCOUNTER — Encounter: Payer: Self-pay | Admitting: Psychiatry

## 2020-08-02 ENCOUNTER — Ambulatory Visit (INDEPENDENT_AMBULATORY_CARE_PROVIDER_SITE_OTHER): Payer: BC Managed Care – PPO | Admitting: Psychiatry

## 2020-08-02 ENCOUNTER — Other Ambulatory Visit: Payer: Self-pay

## 2020-08-02 DIAGNOSIS — F5105 Insomnia due to other mental disorder: Secondary | ICD-10-CM | POA: Diagnosis not present

## 2020-08-02 DIAGNOSIS — F3181 Bipolar II disorder: Secondary | ICD-10-CM

## 2020-08-02 DIAGNOSIS — F4310 Post-traumatic stress disorder, unspecified: Secondary | ICD-10-CM | POA: Diagnosis not present

## 2020-08-02 MED ORDER — MIRTAZAPINE 15 MG PO TABS: 15.0000 mg | ORAL_TABLET | Freq: Every day | ORAL | 1 refills | Status: DC

## 2020-08-02 MED ORDER — BUPROPION HCL ER (SR) 100 MG PO TB12: 200.0000 mg | ORAL_TABLET | ORAL | 1 refills | Status: DC

## 2020-08-02 MED ORDER — ARIPIPRAZOLE 15 MG PO TABS: 15.0000 mg | ORAL_TABLET | Freq: Every day | ORAL | 1 refills | Status: DC

## 2020-08-02 NOTE — Telephone Encounter (Signed)
Apt this afternoon 
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
## 2020-08-11 DIAGNOSIS — M1990 Unspecified osteoarthritis, unspecified site: Secondary | ICD-10-CM | POA: Diagnosis not present

## 2020-08-23 DIAGNOSIS — E785 Hyperlipidemia, unspecified: Secondary | ICD-10-CM | POA: Diagnosis not present

## 2020-08-23 DIAGNOSIS — I4891 Unspecified atrial fibrillation: Secondary | ICD-10-CM | POA: Diagnosis not present

## 2020-08-23 DIAGNOSIS — K219 Gastro-esophageal reflux disease without esophagitis: Secondary | ICD-10-CM | POA: Diagnosis not present

## 2020-08-23 DIAGNOSIS — E039 Hypothyroidism, unspecified: Secondary | ICD-10-CM | POA: Diagnosis not present

## 2020-08-23 DIAGNOSIS — Z23 Encounter for immunization: Secondary | ICD-10-CM | POA: Diagnosis not present

## 2020-08-26 ENCOUNTER — Ambulatory Visit
Admission: RE | Admit: 2020-08-26 | Discharge: 2020-08-26 | Disposition: A | Discharge status: Home / Self Care | Payer: BC Managed Care – PPO | Source: Ambulatory Visit | Attending: Family Medicine | Admitting: Family Medicine

## 2020-08-26 ENCOUNTER — Other Ambulatory Visit: Payer: Self-pay

## 2020-08-26 ENCOUNTER — Other Ambulatory Visit: Payer: Self-pay | Admitting: Family Medicine

## 2020-08-26 DIAGNOSIS — J45909 Unspecified asthma, uncomplicated: Secondary | ICD-10-CM

## 2020-08-26 DIAGNOSIS — Z01818 Encounter for other preprocedural examination: Secondary | ICD-10-CM | POA: Diagnosis not present

## 2020-08-30 DIAGNOSIS — M1712 Unilateral primary osteoarthritis, left knee: Secondary | ICD-10-CM | POA: Diagnosis not present

## 2020-08-30 DIAGNOSIS — R531 Weakness: Secondary | ICD-10-CM | POA: Diagnosis not present

## 2020-08-30 DIAGNOSIS — M25661 Stiffness of right knee, not elsewhere classified: Secondary | ICD-10-CM | POA: Diagnosis not present

## 2020-08-30 DIAGNOSIS — M1711 Unilateral primary osteoarthritis, right knee: Secondary | ICD-10-CM | POA: Diagnosis not present

## 2020-09-05 HISTORY — PX: REPLACEMENT TOTAL KNEE: SUR1224

## 2020-09-07 DIAGNOSIS — Z01818 Encounter for other preprocedural examination: Secondary | ICD-10-CM | POA: Diagnosis not present

## 2020-09-07 DIAGNOSIS — R000 Tachycardia, unspecified: Secondary | ICD-10-CM | POA: Diagnosis not present

## 2020-09-07 DIAGNOSIS — R030 Elevated blood-pressure reading, without diagnosis of hypertension: Secondary | ICD-10-CM | POA: Diagnosis not present

## 2020-09-07 NOTE — Progress Notes (Signed)
  Patient referred by Elkins, Wilson Oliver, * for pre-op risk stratification  Subjective:   Victoria Camacho, female    DOB: 04/08/1971, 50 y.o.   MRN: [id number]   Chief Complaint  Patient presents with  . Pre-op Exam  . Coronary Artery Disease  . Medical Clearance    Pt having right knee surgery 09/09/20     HPI  50 y.o. Caucasian female with history of asthma, anxiety, referred for preoperative risk stratification  Patient has prior history of anxiety.  Prior history of any cardiac illness.  She is non-smoker.  She has a family history of coronary artery disease in her mother.  She works as a nanny, working with infants.  She denies any chest pain, shortness of breath symptoms at baseline.  She is scheduled to undergo right knee replacement surgery tomorrow 09/09/2020 with Dr. Dalldorf at [facility].  Blood pressure and heart rate elevated today, remain elevated in spite of repeated checks.  Patient tells me she has normal blood pressure at home.  This was confirmed with her recent PCP visit.  Past Medical History:  Diagnosis Date  . Anxiety   . Arthritis   . Asthma   . Bladder pain   . Bulging lumbar disc   . Depression   . Endometriosis   . GERD (gastroesophageal reflux disease)   . History of DVT of lower extremity    2011  . Hypertension   . Interstitial cystitis   . Wears contact lenses      Past Surgical History:  Procedure Laterality Date  . CARPAL TUNNEL RELEASE Bilateral left 2000/   right 1993  . CYSTO WITH HYDRODISTENSION N/A 01/21/2014   Procedure: CYSTO WITH HYDRODISTENSION, AND INSTILLATION OF MARCAINE AND PYRIDIUM;  Surgeon: Benjamin W Herrick, MD;  Location: [facility];  Service: Urology;  Laterality: N/A;  . CYSTO/  HYDRODISTENTION/ INSTILLATION THERAPY  01-11-2010/   1995  . DX LAPAROSCOPY /  BIOPSY AND FULGERATION ENDOMETRIOSIS  12-20-2009  . LAPAROSCOPIC CHOLECYSTECTOMY  2000  . LUMBAR EPIDURAL INJECTION  2012  (w/  MAC)  . TUBAL LIGATION  02/ 2000  . VAGINAL HYSTERECTOMY  04-18-2010     Social History   Tobacco Use  Smoking Status Former Smoker  . Packs/day: 0.25  . Types: Cigarettes  Smokeless Tobacco Never Used    Social History   Substance and Sexual Activity  Alcohol Use No  . Alcohol/week: 0.0 standard drinks   Comment: Recovering alcoholic     Family History  Problem Relation Age of Onset  . Diabetes Mother   . Hypertension Father      Current Outpatient Medications on File Prior to Visit  Medication Sig Dispense Refill  . albuterol (PROAIR HFA) 108 (90 Base) MCG/ACT inhaler Inhale 2 puffs into the lungs every 6 (six) hours as needed for wheezing or shortness of breath (wheezing).     . ARIPiprazole (ABILIFY) 15 MG tablet Take 1 tablet (15 mg total) by mouth daily. 90 tablet 1  . buPROPion (WELLBUTRIN SR) 100 MG 12 hr tablet Take 2 tablets (200 mg total) by mouth every morning. 180 tablet 1  . cloNIDine (CATAPRES) 0.1 MG tablet TAKE 1/2 TABLET IN AM AND 1 TAB AT NIGHT (Patient not taking: Reported on 08/02/2020) 135 tablet 0  . famotidine (PEPCID) 20 MG tablet Take 20 mg by mouth 2 (two) times daily.    . mirtazapine (REMERON) 15 MG tablet Take 1 tablet (15 mg total) by mouth at   bedtime. 90 tablet 1   No current facility-administered medications on file prior to visit.    Cardiovascular and other pertinent studies:  EKG 09/08/2020: Sinus tachycardia 120 bpm  RSR(V1) -nondiagnostic Inferior ST depression, consider ischemia   Recent labs: 08/23/2020: Glucose 85, BUN/Cr 21/0.79. EGFR 91. Na/K 135/3.9. Rest of the CMP normal H/H 15.6/45.1. MCV 92. Platelets 240 HbA1C 5.1% Chol 183, TG 174, HDL 65, LDL 89 TSH 3.2 normal    Review of Systems  Cardiovascular: Negative for chest pain, dyspnea on exertion, leg swelling, palpitations and syncope.  Psychiatric/Behavioral: The patient is nervous/anxious.        Vitals:   09/08/20 0835 09/08/20 0841  BP: (!) 143/114  (!) 149/111  Pulse: (!) 123 (!) 124  Temp: 97.9 F (36.6 C)   SpO2: 99% 98%     Body mass index is 35.74 kg/m. Filed Weights   09/08/20 0835  Weight: 183 lb (83 kg)     Objective:   Physical Exam Vitals and nursing note reviewed.  Constitutional:      General: She is not in acute distress. Neck:     Vascular: No JVD.  Cardiovascular:     Rate and Rhythm: Regular rhythm. Tachycardia present.     Heart sounds: Normal heart sounds. No murmur heard.   Pulmonary:     Effort: Pulmonary effort is normal.     Breath sounds: Normal breath sounds. No wheezing or rales.  Musculoskeletal:     Right lower leg: No edema.     Left lower leg: No edema.  Psychiatric:     Comments: Anxious          Assessment & Recommendations:   50 y.o. Caucasian female with history of asthma, anxiety, referred for preoperative risk stratification  Preoperative risk stratification: No angina/angina equivalent symptoms at baseline.  Nonspecific EKG, no concerns for acute ischemia. Overall, cardiac risk for noncardiac surgery of knee replacement is low.  I do not think she needs any further cardiac testing at this time. Patient is hypertensive and tachycardic today.  I suspect this is due to anxiety, given her normal blood pressures at baseline.  I personally communicated my recommendations below to Dr. Jerald Kief PA over the phone. I recommend using IV labetalol or IV perioperatively to control heart rate and blood pressure.  Thank you for referring the patient to [place]. Please feel free to contact with any questions.   Nigel Mormon, MD Pager: [phone] Office: [phone]

## 2020-09-08 ENCOUNTER — Other Ambulatory Visit: Payer: Self-pay

## 2020-09-08 ENCOUNTER — Encounter: Payer: Self-pay | Admitting: Cardiology

## 2020-09-08 ENCOUNTER — Ambulatory Visit: Payer: BC Managed Care – PPO | Admitting: Cardiology

## 2020-09-08 VITALS — BP 173/108 | HR 117 | Temp 97.9°F | Ht 60.0 in | Wt 183.0 lb

## 2020-09-08 DIAGNOSIS — Z01818 Encounter for other preprocedural examination: Secondary | ICD-10-CM

## 2020-09-08 DIAGNOSIS — R000 Tachycardia, unspecified: Secondary | ICD-10-CM

## 2020-09-08 DIAGNOSIS — R030 Elevated blood-pressure reading, without diagnosis of hypertension: Secondary | ICD-10-CM | POA: Insufficient documentation

## 2020-09-09 DIAGNOSIS — M1711 Unilateral primary osteoarthritis, right knee: Secondary | ICD-10-CM | POA: Diagnosis not present

## 2020-09-21 DIAGNOSIS — Z9889 Other specified postprocedural states: Secondary | ICD-10-CM | POA: Diagnosis not present

## 2020-11-17 DIAGNOSIS — Z96659 Presence of unspecified artificial knee joint: Secondary | ICD-10-CM | POA: Diagnosis not present

## 2020-12-03 NOTE — Progress Notes (Signed)
Patient referred by [name], * for pre-op risk stratification  Subjective:   Victoria Camacho, female    DOB: [birth year], 50 y.o.   MRN: [id number]   Chief Complaint  Patient presents with   Tachycardia   Follow-up     HPI  50 y.o. Caucasian female with history of asthma, anxiety, referred for preoperative risk stratification  Patient underwent right knee replacement without any significant cardiac events.  She tells me that her blood pressure was elevated which was managed perioperatively.  She has done psychotherapy, denies any chest pain or shortness of breath with activity.  However, heart rate and blood pressure elevated again at the visit today.  Initial consultation HPI 09/2020: Patient has prior history of anxiety.  Prior history of any cardiac illness.  She is non-smoker.  She has a family history of coronary artery disease in her mother.  She works as a Surveyor, minerals, working with infants.  She denies any chest pain, shortness of breath symptoms at baseline.  She is scheduled to undergo right knee replacement surgery tomorrow 09/09/2020 with Dr. Rhona Raider at [facility].  Blood pressure and heart rate elevated today, remain elevated in spite of repeated checks.  Patient tells me she has normal blood pressure at home.  This was confirmed with her recent PCP visit.  Current Outpatient Medications on File Prior to Visit  Medication Sig Dispense Refill   albuterol (PROAIR HFA) 108 (90 Base) MCG/ACT inhaler Inhale 2 puffs into the lungs every 6 (six) hours as needed for wheezing or shortness of breath (wheezing).      ARIPiprazole (ABILIFY) 15 MG tablet Take 1 tablet (15 mg total) by mouth daily. 90 tablet 1   buPROPion (WELLBUTRIN SR) 100 MG 12 hr tablet Take 2 tablets (200 mg total) by mouth every morning. 180 tablet 1   famotidine (PEPCID) 20 MG tablet Take 20 mg by mouth 2 (two) times daily.     mirtazapine (REMERON) 15 MG tablet Take 1 tablet (15 mg total)  by mouth at bedtime. 90 tablet 1   No current facility-administered medications on file prior to visit.    Cardiovascular and other pertinent studies:  EKG 09/08/2020: Sinus tachycardia 120 bpm  RSR(V1) -nondiagnostic Inferior ST depression, consider ischemia   Recent labs: 08/23/2020: Glucose 85, BUN/Cr 21/0.79. EGFR 91. Na/K 135/3.9. Rest of the CMP normal H/H 15.6/45.1. MCV 92. Platelets 240 HbA1C 5.1% Chol 183, TG 174, HDL 65, LDL 89 TSH 3.2 normal    Review of Systems  Cardiovascular:  Negative for chest pain, dyspnea on exertion, leg swelling, palpitations and syncope.  Psychiatric/Behavioral:  The patient is nervous/anxious.       Vitals:   12/10/20 0830 12/10/20 0831  BP: (!) 169/131 (!) 151/110  Pulse: (!) 114 (!) 113  Resp: 16   Temp: 98 F (36.7 C)   SpO2: 98%     Body mass index is 36.72 kg/m. Filed Weights   12/10/20 0830  Weight: 188 lb (85.3 kg)     Objective:   Physical Exam Vitals and nursing note reviewed.  Constitutional:      General: She is not in acute distress. Neck:     Vascular: No JVD.  Cardiovascular:     Rate and Rhythm: Regular rhythm. Tachycardia present.     Heart sounds: Normal heart sounds. No murmur heard. Pulmonary:     Effort: Pulmonary effort is normal.     Breath sounds: Normal breath sounds. No wheezing or rales.  Musculoskeletal:     Right lower leg: No edema.     Left lower leg: No edema.     Comments: Rt knee post op scar  Psychiatric:     Comments: Anxious         Assessment & Recommendations:   50 y.o. Caucasian female with history of asthma, anxiety, referred for preoperative risk stratification  Hypertension, tachycardia: Reportedly normal blood pressure at home.  I suspect anxiety may be playing a role.  Also need to check TSH.  I will place her on no new cardiac telemetry.  Encouraged her to check blood pressure regularly at home.  We will follow-up in a few weeks to review home blood pressure  readings as well as cardiac telemetry and TSH results   F/u in 3-4 weeks with Lawerance Cruel, PA   Nigel Mormon, MD Pager: [phone] Office: [phone]

## 2020-12-10 ENCOUNTER — Encounter: Payer: Self-pay | Admitting: Cardiology

## 2020-12-10 ENCOUNTER — Inpatient Hospital Stay: Payer: BC Managed Care – PPO

## 2020-12-10 ENCOUNTER — Other Ambulatory Visit: Payer: Self-pay

## 2020-12-10 ENCOUNTER — Ambulatory Visit: Payer: BC Managed Care – PPO | Admitting: Cardiology

## 2020-12-10 VITALS — BP 151/110 | HR 113 | Temp 98.0°F | Resp 16 | Ht 60.0 in | Wt 188.0 lb

## 2020-12-10 DIAGNOSIS — R000 Tachycardia, unspecified: Secondary | ICD-10-CM

## 2020-12-10 DIAGNOSIS — R030 Elevated blood-pressure reading, without diagnosis of hypertension: Secondary | ICD-10-CM

## 2020-12-13 DIAGNOSIS — R000 Tachycardia, unspecified: Secondary | ICD-10-CM | POA: Diagnosis not present

## 2020-12-13 DIAGNOSIS — R030 Elevated blood-pressure reading, without diagnosis of hypertension: Secondary | ICD-10-CM | POA: Diagnosis not present

## 2020-12-14 LAB — TSH: TSH: 4.99 u[IU]/mL — ABNORMAL HIGH (ref 0.450–4.500)

## 2020-12-17 ENCOUNTER — Telehealth: Payer: Self-pay | Admitting: Student

## 2020-12-17 DIAGNOSIS — Z96651 Presence of right artificial knee joint: Secondary | ICD-10-CM | POA: Diagnosis not present

## 2020-12-17 DIAGNOSIS — Z471 Aftercare following joint replacement surgery: Secondary | ICD-10-CM | POA: Diagnosis not present

## 2020-12-17 NOTE — Telephone Encounter (Signed)
Megan w/[employer] called about a monitor on Pt that's not registered. She stated that it's showing this monitor is not registered, though the Pt has the monitor.

## 2020-12-17 NOTE — Telephone Encounter (Signed)
Done

## 2020-12-30 NOTE — Progress Notes (Signed)
Patient referred by [name], * for pre-op risk stratification  Subjective:   Victoria Camacho, female    DOB: [birth year], 50 y.o.   MRN: [id number]   Chief Complaint  Patient presents with   Tachycardia   Follow-up     HPI  50 y.o. Caucasian female with history of asthma, anxiety, referred for preoperative risk stratification  Patient presents for 4-week follow-up of hypertension and tachycardia.  Last office visit ordered cardiac monitor and TSH advised patient to record blood pressure readings.  TSH high at 4.99.  Cardiac monitor revealed SVT and PAC/PVC, however there were no patient triggered events. Home blood pressure readings averaging 135-140/85-95.   Current Outpatient Medications on File Prior to Visit  Medication Sig Dispense Refill   albuterol (PROAIR HFA) 108 (90 Base) MCG/ACT inhaler Inhale 2 puffs into the lungs every 6 (six) hours as needed for wheezing or shortness of breath (wheezing).      ARIPiprazole (ABILIFY) 15 MG tablet Take 1 tablet (15 mg total) by mouth daily. 90 tablet 1   buPROPion (WELLBUTRIN SR) 100 MG 12 hr tablet Take 2 tablets (200 mg total) by mouth every morning. 180 tablet 1   famotidine (PEPCID) 20 MG tablet Take 20 mg by mouth 2 (two) times daily.     mirtazapine (REMERON) 15 MG tablet Take 1 tablet (15 mg total) by mouth at bedtime. 90 tablet 1   No current facility-administered medications on file prior to visit.    Cardiovascular and other pertinent studies: EKG 01/03/2021: Sinus tachycardia at a rate of 101 bpm.  Inferior ST depression unchanged compared to previous, consider ischemia.  Mobile cardiac telemetry 6 days 12/10/2020 - 12/17/2020: Dominant rhythm: Sinus. HR 65-141 bpm. Avg HR 96 bpm. 2 episodes of SVT, fastest at 179 bpm for 4 beats, longest for 5 beats at 157 bpm. <1% isolated SVE, couplet/triplets. 0 episodes of VT <1% isolated VE No atrial fibrillation/atrial flutter/VT/high grade AV block, sinus  pause >3sec noted. 0 patient triggered events.   EKG 09/08/2020: Sinus tachycardia 120 bpm  RSR(V1) -nondiagnostic Inferior ST depression, consider ischemia   Recent labs: 12/13/2020: TSH 4.09  08/23/2020: Glucose 85, BUN/Cr 21/0.79. EGFR 91. Na/K 135/3.9. Rest of the CMP normal H/H 15.6/45.1. MCV 92. Platelets 240 HbA1C 5.1% Chol 183, TG 174, HDL 65, LDL 89 TSH 3.2 normal    Review of Systems  Constitutional: Negative for malaise/fatigue and weight gain.  Cardiovascular:  Negative for chest pain, claudication, dyspnea on exertion, leg swelling, near-syncope, orthopnea, palpitations, paroxysmal nocturnal dyspnea and syncope.  Respiratory:  Negative for shortness of breath.   Neurological:  Negative for dizziness.  Psychiatric/Behavioral:  The patient is nervous/anxious.       Vitals:   01/03/21 0849 01/03/21 0858  BP: (!) 158/112 (!) 166/125  Pulse: (!) 117 (!) 118  Temp: 98.9 F (37.2 C)   SpO2: 98%     Body mass index is 36.48 kg/m. Filed Weights   01/03/21 0849  Weight: 186 lb 12.8 oz (84.7 kg)     Objective:   Physical Exam Vitals and nursing note reviewed.  Constitutional:      General: She is not in acute distress. Neck:     Vascular: No JVD.  Cardiovascular:     Rate and Rhythm: Regular rhythm. Tachycardia present.     Heart sounds: Normal heart sounds. No murmur heard. Pulmonary:     Effort: Pulmonary effort is normal.     Breath sounds: Normal breath sounds.  No wheezing or rales.  Musculoskeletal:     Right lower leg: No edema.     Left lower leg: No edema.     Comments: Rt knee post op scar  Psychiatric:     Comments: Anxious   Physical exam unchanged compared to previous.      Assessment & Recommendations:   50 y.o. Caucasian female with history of asthma, anxiety, referred for preoperative risk stratification  Hypertension, tachycardia: Patient's TSH is high, will defer management to PCP. Have shared labs with PCP.  Reviewed and  discussed with patient results of ambulatory cardiac telemetry, details above.  Cardiac monitor revealed PACs and PVCs as well as 2 episodes of SVT.  Patient blood pressure remains uncontrolled in the office EKG reveals sinus tachycardia. Will start patient on metoprolol succinate 25 mg once daily.  We will continue to monitor blood pressure and heart rate. Suspect tachycardia may improve with management of thyroid dysfunction. Advised patient to call PCP for follow up appointment regarding TSH.   Follow-up in 4 weeks, sooner if needed, for hypertension and tachycardia.   Alethia Berthold, PA-C 01/06/2021, 1:13 PM Office: [phone]

## 2021-01-03 ENCOUNTER — Ambulatory Visit: Payer: BC Managed Care – PPO | Admitting: Student

## 2021-01-03 ENCOUNTER — Encounter: Payer: Self-pay | Admitting: Student

## 2021-01-03 ENCOUNTER — Other Ambulatory Visit: Payer: Self-pay

## 2021-01-03 VITALS — BP 166/125 | HR 118 | Temp 98.9°F | Ht 60.0 in | Wt 186.8 lb

## 2021-01-03 DIAGNOSIS — R000 Tachycardia, unspecified: Secondary | ICD-10-CM | POA: Diagnosis not present

## 2021-01-03 DIAGNOSIS — R030 Elevated blood-pressure reading, without diagnosis of hypertension: Secondary | ICD-10-CM

## 2021-01-03 MED ORDER — METOPROLOL SUCCINATE ER 25 MG PO TB24: 25.0000 mg | ORAL_TABLET | Freq: Every day | ORAL | 3 refills | Status: DC

## 2021-01-04 DIAGNOSIS — R000 Tachycardia, unspecified: Secondary | ICD-10-CM | POA: Diagnosis not present

## 2021-01-11 ENCOUNTER — Ambulatory Visit: Payer: BC Managed Care – PPO | Admitting: Student

## 2021-01-27 NOTE — Progress Notes (Signed)
Patient referred by Victoria Camacho, * for pre-op risk stratification  Subjective:   Victoria Camacho, female    DOB: August 04, 1970, 50 y.o.   MRN: [id number]   Chief Complaint  Patient presents with   Hypertension   Tachycardia     HPI  50 y.o. Caucasian female with history of asthma, anxiety, referred for preoperative risk stratification  Patient presents for 4-week follow-up of hypertension and tachycardia.  Last office visit added metoprolol succinate 25 mg once daily and advised patient to follow-up with PCP regarding elevated TSH.  Since last office visit patient has followed up with PCP who made no changes to medications and recommended repeat TSH in 1 year for recheck.    Notably today patient brings her home blood pressure monitor with her to check with our machines, and her home blood pressure monitor is accurate.  She also brings with her a written log of home blood pressure readings, with average 120/80 mmHg.  Patient is feeling well overall without specific complaints today.  Denies chest pain, palpitations, dyspnea, syncope, near syncope.  Denies orthopnea, PND, leg edema.  Current Outpatient Medications on File Prior to Visit  Medication Sig Dispense Refill   albuterol (PROAIR HFA) 108 (90 Base) MCG/ACT inhaler Inhale 2 puffs into the lungs every 6 (six) hours as needed for wheezing or shortness of breath (wheezing).      ARIPiprazole (ABILIFY) 15 MG tablet Take 1 tablet (15 mg total) by mouth daily. 90 tablet 1   buPROPion (WELLBUTRIN SR) 100 MG 12 hr tablet Take 2 tablets (200 mg total) by mouth every morning. 180 tablet 1   famotidine (PEPCID) 20 MG tablet Take 20 mg by mouth 2 (two) times daily.     mirtazapine (REMERON) 15 MG tablet Take 1 tablet (15 mg total) by mouth at bedtime. 90 tablet 1   No current facility-administered medications on file prior to visit.    Cardiovascular and other pertinent studies: EKG 01/03/2021: Sinus tachycardia at a rate  of 101 bpm.  Inferior ST depression unchanged compared to previous, consider ischemia.  Mobile cardiac telemetry 6 days 12/10/2020 - 12/17/2020: Dominant rhythm: Sinus. HR 65-141 bpm. Avg HR 96 bpm. 2 episodes of SVT, fastest at 179 bpm for 4 beats, longest for 5 beats at 157 bpm. <1% isolated SVE, couplet/triplets. 0 episodes of VT <1% isolated VE No atrial fibrillation/atrial flutter/VT/high grade AV block, sinus pause >3sec noted. 0 patient triggered events.   EKG 09/08/2020: Sinus tachycardia 120 bpm  RSR(V1) -nondiagnostic Inferior ST depression, consider ischemia   Recent labs: 12/13/2020: TSH 4.09  08/23/2020: Glucose 85, BUN/Cr 21/0.79. EGFR 91. Na/K 135/3.9. Rest of the CMP normal H/H 15.6/45.1. MCV 92. Platelets 240 HbA1C 5.1% Chol 183, TG 174, HDL 65, LDL 89 TSH 3.2 normal    Review of Systems  Cardiovascular:  Negative for chest pain, claudication, dyspnea on exertion, leg swelling, near-syncope, orthopnea, palpitations, paroxysmal nocturnal dyspnea and syncope.  Neurological:  Negative for dizziness.  Psychiatric/Behavioral:  The patient is nervous/anxious.       Vitals:   01/31/21 0913 01/31/21 0917  BP: (!) 160/114 (!) 162/105  Pulse: 98 96  SpO2: 97% 98%    Body mass index is 37.03 kg/m. Filed Weights   01/31/21 0913  Weight: 189 lb 9.6 oz (86 kg)     Objective:   Physical Exam Vitals and nursing note reviewed.  Constitutional:      General: She is not in acute distress. Neck:  Vascular: No JVD.  Cardiovascular:     Rate and Rhythm: Normal rate and regular rhythm.     Heart sounds: Normal heart sounds. No murmur heard. Pulmonary:     Effort: Pulmonary effort is normal.     Breath sounds: Normal breath sounds. No wheezing or rales.  Musculoskeletal:     Right lower leg: No edema.     Left lower leg: No edema.     Comments: Rt knee post op scar  Psychiatric:     Comments: Anxious        Assessment & Recommendations:   50 y.o.  Caucasian female with history of asthma, anxiety, originally referred for preoperative risk stratification, now followed for hypertension and tachycardia.    Hypertension, tachycardia: Patient's TSH is elevated, PCP recommends recheck in 1 year, otherwise no changes. Patient's blood pressure is elevated in the office, suspect component of whitecoat hypertension as her home blood pressure monitor is accurate and home blood pressure readings are well controlled. Will continue metoprolol succinate, no changes to medications at this time. Heart rate control has also improved with initiation of metoprolol.   Follow-up in 6 months, sooner if needed, for hypertension and tachycardia.   Victoria Berthold, PA-C 01/31/2021, 10:13 AM Office: [phone]

## 2021-01-31 ENCOUNTER — Other Ambulatory Visit: Payer: Self-pay

## 2021-01-31 ENCOUNTER — Encounter: Payer: Self-pay | Admitting: Student

## 2021-01-31 ENCOUNTER — Ambulatory Visit: Payer: 59 | Admitting: Student

## 2021-01-31 VITALS — BP 162/105 | HR 96 | Ht 60.0 in | Wt 189.6 lb

## 2021-01-31 DIAGNOSIS — R000 Tachycardia, unspecified: Secondary | ICD-10-CM

## 2021-01-31 DIAGNOSIS — R030 Elevated blood-pressure reading, without diagnosis of hypertension: Secondary | ICD-10-CM

## 2021-01-31 MED ORDER — METOPROLOL SUCCINATE ER 25 MG PO TB24: 25.0000 mg | ORAL_TABLET | Freq: Every day | ORAL | 3 refills | Status: AC

## 2021-02-02 ENCOUNTER — Encounter: Payer: Self-pay | Admitting: Psychiatry

## 2021-02-02 ENCOUNTER — Ambulatory Visit (INDEPENDENT_AMBULATORY_CARE_PROVIDER_SITE_OTHER): Payer: 59 | Admitting: Psychiatry

## 2021-02-02 ENCOUNTER — Other Ambulatory Visit: Payer: Self-pay

## 2021-02-02 DIAGNOSIS — R69 Illness, unspecified: Secondary | ICD-10-CM | POA: Diagnosis not present

## 2021-02-02 DIAGNOSIS — F3181 Bipolar II disorder: Secondary | ICD-10-CM

## 2021-02-02 DIAGNOSIS — F4310 Post-traumatic stress disorder, unspecified: Secondary | ICD-10-CM | POA: Diagnosis not present

## 2021-02-02 DIAGNOSIS — F5105 Insomnia due to other mental disorder: Secondary | ICD-10-CM | POA: Diagnosis not present

## 2021-02-02 MED ORDER — MIRTAZAPINE 15 MG PO TABS: 15.0000 mg | ORAL_TABLET | Freq: Every day | ORAL | 2 refills | Status: DC

## 2021-02-02 MED ORDER — ARIPIPRAZOLE 15 MG PO TABS: 15.0000 mg | ORAL_TABLET | Freq: Every day | ORAL | 2 refills | Status: DC

## 2021-02-02 MED ORDER — BUPROPION HCL ER (SR) 100 MG PO TB12: 200.0000 mg | ORAL_TABLET | ORAL | 2 refills | Status: DC
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
## 2021-08-01 ENCOUNTER — Ambulatory Visit: Payer: 59 | Admitting: Student

## 2021-08-08 NOTE — Progress Notes (Signed)
? ? ?Patient referred by [name], * for pre-op risk stratification ? ?Subjective:  ? ?Victoria Camacho, female    DOB: 1970/09/20, 51 y.o.   MRN: [id number] ? ? ?Chief Complaint  ?Patient presents with  ? Hypertension  ? SVT  ? Follow-up  ?  6 month  ? ? ? ?HPI ? ?51 y.o. Caucasian female with history of asthma, anxiety, referred for preoperative risk stratification ? ?Patient presents for 6 month follow up. At last office visit no changes were make and elevated TSH was being managed by PCP. Patient is feeling well without specific complaints today. She is scheduled for follow TSH with PCP in September. She brings with her a written log of home BP and HR readings which are well controlled averaging 120s/80s mmHg and 75-85 bpm respectively.  ? ?Patient's weight has trended down since last visit and she continues to work with weight management clinic for further weight reduction.  ? ? Denies chest pain, palpitations, dyspnea, syncope, near syncope.  Denies orthopnea, PND, leg edema. ? ?Notably patient has a component of white coat hypertension and tachycardia as her home monitor has been calibrated with the office.  ? ?Current Outpatient Medications on File Prior to Visit  ?Medication Sig Dispense Refill  ? albuterol (PROAIR HFA) 108 (90 Base) MCG/ACT inhaler Inhale 2 puffs into the lungs every 6 (six) hours as needed for wheezing or shortness of breath (wheezing).    ? ARIPiprazole (ABILIFY) 15 MG tablet Take 1 tablet (15 mg total) by mouth daily. 90 tablet 2  ? buPROPion ER (WELLBUTRIN SR) 100 MG 12 hr tablet Take 2 tablets (200 mg total) by mouth every morning. 180 tablet 2  ? famotidine (PEPCID) 20 MG tablet Take 20 mg by mouth 2 (two) times daily.    ? metoprolol succinate (TOPROL-XL) 25 MG 24 hr tablet Take 1 tablet (25 mg total) by mouth daily. Take with or immediately following a meal. 90 tablet 3  ? mirtazapine (REMERON) 15 MG tablet Take 1 tablet (15 mg total) by mouth at bedtime. 90 tablet 2   ? ?No current facility-administered medications on file prior to visit.  ? ? ?Cardiovascular and other pertinent studies: ?EKG 08/09/2021:  ?Sinus rhythm at a rate of 93 bpm.  Inferior ST depression unchanged compared to previous, consider ischemia.  ? ?Mobile cardiac telemetry 6 days 12/10/2020 - 12/17/2020: ?Dominant rhythm: Sinus. ?HR 65-141 bpm. Avg HR 96 bpm. ?2 episodes of SVT, fastest at 179 bpm for 4 beats, longest for 5 beats at 157 bpm. ?<1% isolated SVE, couplet/triplets. ?0 episodes of VT ?<1% isolated VE ?No atrial fibrillation/atrial flutter/VT/high grade AV block, sinus pause >3sec noted. ?0 patient triggered events.  ? ?EKG 09/08/2020: ?Sinus tachycardia 120 bpm  ?RSR(V1) -nondiagnostic ?Inferior ST depression, consider ischemia ? ? ?Recent labs: ? ?  Latest Ref Rng & Units 11/10/2014  ?  3:53 PM 12/12/2013  ? 10:30 PM 11/05/2013  ?  3:56 PM  ?CMP  ?Glucose 70 - 99 mg/dL 86   95   83    ?BUN 6 - 23 mg/dL _0 ?Creatinine 0.50 - 1.10 mg/dL 0.67   0.78   0.65    ?Sodium 135 - 145 mEq/L 136   137   135    ?Potassium 3.5 - 5.3 mEq/L 4.0   3.6   3.5    ?Chloride 96 - 112 mEq/L 103   99   102    ?  CO2 19 - 32 mEq/L _0 ?Calcium 8.4 - 10.5 mg/dL 9.5   10.1   9.2    ?Total Protein 6.0 - 8.3 g/dL 6.4    6.1    ?Total Bilirubin 0.2 - 1.2 mg/dL 0.3    0.4    ?Alkaline Phos 39 - 117 U/L 40    36    ?AST 0 - 37 U/L 15    11    ?ALT 0 - 35 U/L 15    12    ? ? ?  Latest Ref Rng & Units 11/10/2014  ?  3:53 PM 01/21/2014  ? 10:48 AM 12/12/2013  ? 10:30 PM  ?CBC  ?WBC 4.0 - 10.5 K/uL 8.0    7.4    ?Hemoglobin 12.0 - 15.0 g/dL 13.5   14.5   14.7    ?Hematocrit 36.0 - 46.0 % 39.9    42.9    ?Platelets 150 - 400 K/uL 219    197    ? ?Lipid Panel  ?   ?Component Value Date/Time  ? CHOL 170 07/11/2018 1526  ? TRIG 94 07/11/2018 1526  ? HDL 59 07/11/2018 1526  ? CHOLHDL 2.9 07/11/2018 1526  ? VLDL 15 11/10/2014 1553  ? Fraser 92 07/11/2018 1526  ? ?HEMOGLOBIN A1C ?Lab Results  ?Component Value Date  ? HGBA1C 5.1  07/11/2018  ? MPG 100 07/11/2018  ? ?TSH ?Recent Labs  ?  12/13/20 ?0819  ?TSH 4.990*  ?  ?12/13/2020: ?TSH 4.09 ? ?08/23/2020: ?Glucose 85, BUN/Cr 21/0.79. EGFR 91. Na/K 135/3.9. Rest of the CMP normal ?H/H 15.6/45.1. MCV 92. Platelets 240 ?HbA1C 5.1% ?Chol 183, TG 174, HDL 65, LDL 89 ?TSH 3.2 normal ? ? ? ?Review of Systems  ?Cardiovascular:  Negative for chest pain, claudication, dyspnea on exertion, leg swelling, near-syncope, orthopnea, palpitations, paroxysmal nocturnal dyspnea and syncope.  ?Neurological:  Negative for dizziness.  ?Psychiatric/Behavioral:  The patient is nervous/anxious.   ? ?   ?Vitals:  ? 08/09/21 0853 08/09/21 0859  ?BP: (!) 137/93 (!) 137/95  ?Pulse: 98 (!) 101  ?Resp: 16   ?Temp: 98 ?F (36.7 ?C)   ?SpO2: 97%   ? ? ?Body mass index is 33.98 kg/m?. Danley Danker Weights  ? 08/09/21 0853  ?Weight: 174 lb (78.9 kg)  ? ? ? ?Objective:  ? Physical Exam ?Vitals and nursing note reviewed.  ?Constitutional:   ?   General: She is not in acute distress. ?Neck:  ?   Vascular: No JVD.  ?Cardiovascular:  ?   Rate and Rhythm: Normal rate and regular rhythm.  ?   Heart sounds: Normal heart sounds. No murmur heard. ?Pulmonary:  ?   Effort: Pulmonary effort is normal.  ?   Breath sounds: Normal breath sounds. No wheezing or rales.  ?Musculoskeletal:  ?   Right lower leg: No edema.  ?   Left lower leg: No edema.  ?   Comments: Rt knee post op scar  ?Psychiatric:  ?   Comments: Anxious ?  ?Physical exam unchanged compared to previous.  ?   ? ?Assessment & Recommendations:  ? ?51 y.o. Caucasian female with history of asthma, anxiety, originally referred for preoperative risk stratification, now followed for hypertension and tachycardia.   ?  ?Hypertension, tachycardia: ?Patient's TSH is elevated, PCP following up in September ?Patient has component of white coat hypertension and tachycardia as home monitoring is well controlled.  ?Will not make changes to medications at  this time.  ? ?She is stable from a cardiac  standpoint. Follow up in 1 year, sooner if needed.  ? ? ?Alethia Berthold, PA-C ?08/09/2021, 10:26 AM ?Office: [phone] ?

## 2021-08-09 ENCOUNTER — Encounter: Payer: Self-pay | Admitting: Student

## 2021-08-09 ENCOUNTER — Ambulatory Visit: Payer: 59 | Admitting: Student

## 2021-08-09 VITALS — BP 137/95 | HR 101 | Temp 98.0°F | Resp 16 | Ht 60.0 in | Wt 174.0 lb

## 2021-08-09 DIAGNOSIS — I10 Essential (primary) hypertension: Secondary | ICD-10-CM

## 2021-08-09 DIAGNOSIS — R000 Tachycardia, unspecified: Secondary | ICD-10-CM | POA: Diagnosis not present

## 2021-08-09 DIAGNOSIS — R030 Elevated blood-pressure reading, without diagnosis of hypertension: Secondary | ICD-10-CM

## 2021-10-22 ENCOUNTER — Other Ambulatory Visit: Payer: Self-pay | Admitting: Psychiatry

## 2021-10-22 DIAGNOSIS — F4310 Post-traumatic stress disorder, unspecified: Secondary | ICD-10-CM

## 2021-10-22 DIAGNOSIS — F3181 Bipolar II disorder: Secondary | ICD-10-CM

## 2021-11-02 ENCOUNTER — Encounter: Payer: Self-pay | Admitting: Psychiatry

## 2021-11-02 ENCOUNTER — Ambulatory Visit (INDEPENDENT_AMBULATORY_CARE_PROVIDER_SITE_OTHER): Payer: 59 | Admitting: Psychiatry

## 2021-11-02 DIAGNOSIS — F4310 Post-traumatic stress disorder, unspecified: Secondary | ICD-10-CM

## 2021-11-02 DIAGNOSIS — R69 Illness, unspecified: Secondary | ICD-10-CM | POA: Diagnosis not present

## 2021-11-02 DIAGNOSIS — F3181 Bipolar II disorder: Secondary | ICD-10-CM | POA: Diagnosis not present

## 2021-11-02 DIAGNOSIS — F5105 Insomnia due to other mental disorder: Secondary | ICD-10-CM | POA: Diagnosis not present

## 2021-11-02 MED ORDER — BUPROPION HCL ER (SR) 100 MG PO TB12: 200.0000 mg | ORAL_TABLET | ORAL | 3 refills | Status: DC

## 2021-11-02 MED ORDER — MIRTAZAPINE 15 MG PO TABS: 15.0000 mg | ORAL_TABLET | Freq: Every day | ORAL | 3 refills | Status: DC

## 2021-11-02 MED ORDER — ARIPIPRAZOLE 15 MG PO TABS: 15.0000 mg | ORAL_TABLET | Freq: Every day | ORAL | 3 refills | Status: DC
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
## 2022-01-27 DIAGNOSIS — Z13220 Encounter for screening for lipoid disorders: Secondary | ICD-10-CM | POA: Diagnosis not present

## 2022-01-27 DIAGNOSIS — R946 Abnormal results of thyroid function studies: Secondary | ICD-10-CM | POA: Diagnosis not present

## 2022-01-27 DIAGNOSIS — R69 Illness, unspecified: Secondary | ICD-10-CM | POA: Diagnosis not present

## 2022-01-27 DIAGNOSIS — R7309 Other abnormal glucose: Secondary | ICD-10-CM | POA: Diagnosis not present

## 2022-02-08 DIAGNOSIS — R69 Illness, unspecified: Secondary | ICD-10-CM | POA: Diagnosis not present

## 2022-02-08 DIAGNOSIS — Z1211 Encounter for screening for malignant neoplasm of colon: Secondary | ICD-10-CM | POA: Diagnosis not present

## 2022-02-08 DIAGNOSIS — Z124 Encounter for screening for malignant neoplasm of cervix: Secondary | ICD-10-CM | POA: Diagnosis not present

## 2022-02-08 DIAGNOSIS — Z0000 Encounter for general adult medical examination without abnormal findings: Secondary | ICD-10-CM | POA: Diagnosis not present

## 2022-02-08 DIAGNOSIS — Z23 Encounter for immunization: Secondary | ICD-10-CM | POA: Diagnosis not present

## 2022-02-14 DIAGNOSIS — Z1231 Encounter for screening mammogram for malignant neoplasm of breast: Secondary | ICD-10-CM | POA: Diagnosis not present

## 2022-03-04 DIAGNOSIS — Z1211 Encounter for screening for malignant neoplasm of colon: Secondary | ICD-10-CM | POA: Diagnosis not present

## 2022-03-04 DIAGNOSIS — Z1212 Encounter for screening for malignant neoplasm of rectum: Secondary | ICD-10-CM | POA: Diagnosis not present

## 2022-06-25 ENCOUNTER — Other Ambulatory Visit: Payer: Self-pay | Admitting: Psychiatry

## 2022-06-25 DIAGNOSIS — F5105 Insomnia due to other mental disorder: Secondary | ICD-10-CM

## 2022-06-25 DIAGNOSIS — F4310 Post-traumatic stress disorder, unspecified: Secondary | ICD-10-CM

## 2022-06-25 DIAGNOSIS — F3181 Bipolar II disorder: Secondary | ICD-10-CM

## 2022-06-26 DIAGNOSIS — R195 Other fecal abnormalities: Secondary | ICD-10-CM | POA: Diagnosis not present

## 2022-06-26 DIAGNOSIS — R110 Nausea: Secondary | ICD-10-CM | POA: Diagnosis not present

## 2022-08-10 ENCOUNTER — Ambulatory Visit: Payer: 59 | Admitting: Student

## 2022-10-11 DIAGNOSIS — M79642 Pain in left hand: Secondary | ICD-10-CM | POA: Diagnosis not present

## 2022-11-02 ENCOUNTER — Ambulatory Visit: Payer: 59 | Admitting: Psychiatry

## 2022-11-13 DIAGNOSIS — M79672 Pain in left foot: Secondary | ICD-10-CM | POA: Diagnosis not present

## 2022-11-16 ENCOUNTER — Other Ambulatory Visit: Payer: Self-pay | Admitting: Psychiatry

## 2022-11-16 DIAGNOSIS — F3181 Bipolar II disorder: Secondary | ICD-10-CM

## 2022-11-16 DIAGNOSIS — F4310 Post-traumatic stress disorder, unspecified: Secondary | ICD-10-CM

## 2022-12-09 ENCOUNTER — Other Ambulatory Visit: Payer: Self-pay | Admitting: Psychiatry

## 2022-12-09 DIAGNOSIS — F3181 Bipolar II disorder: Secondary | ICD-10-CM

## 2022-12-09 DIAGNOSIS — F4310 Post-traumatic stress disorder, unspecified: Secondary | ICD-10-CM

## 2022-12-11 DIAGNOSIS — M79672 Pain in left foot: Secondary | ICD-10-CM | POA: Diagnosis not present

## 2023-01-01 ENCOUNTER — Encounter: Payer: Self-pay | Admitting: Psychiatry

## 2023-01-01 ENCOUNTER — Ambulatory Visit: Payer: 59 | Admitting: Psychiatry

## 2023-01-01 DIAGNOSIS — F3181 Bipolar II disorder: Secondary | ICD-10-CM

## 2023-01-01 DIAGNOSIS — F5105 Insomnia due to other mental disorder: Secondary | ICD-10-CM | POA: Diagnosis not present

## 2023-01-01 DIAGNOSIS — F4310 Post-traumatic stress disorder, unspecified: Secondary | ICD-10-CM

## 2023-01-01 MED ORDER — BUPROPION HCL ER (SR) 100 MG PO TB12: 200.0000 mg | ORAL_TABLET | Freq: Every morning | ORAL | 3 refills | Status: DC

## 2023-01-01 MED ORDER — ARIPIPRAZOLE 15 MG PO TABS: 15.0000 mg | ORAL_TABLET | Freq: Every day | ORAL | 3 refills | Status: DC

## 2023-01-01 MED ORDER — MIRTAZAPINE 15 MG PO TABS: 15.0000 mg | ORAL_TABLET | Freq: Every day | ORAL | 3 refills | Status: DC
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
## 2023-01-10 DIAGNOSIS — M79672 Pain in left foot: Secondary | ICD-10-CM | POA: Diagnosis not present

## 2023-02-02 DIAGNOSIS — M79672 Pain in left foot: Secondary | ICD-10-CM | POA: Diagnosis not present

## 2023-02-07 DIAGNOSIS — Z79899 Other long term (current) drug therapy: Secondary | ICD-10-CM | POA: Diagnosis not present

## 2023-02-07 DIAGNOSIS — Z13220 Encounter for screening for lipoid disorders: Secondary | ICD-10-CM | POA: Diagnosis not present

## 2023-02-07 DIAGNOSIS — R946 Abnormal results of thyroid function studies: Secondary | ICD-10-CM | POA: Diagnosis not present

## 2023-02-07 DIAGNOSIS — R7309 Other abnormal glucose: Secondary | ICD-10-CM | POA: Diagnosis not present

## 2023-02-14 DIAGNOSIS — R32 Unspecified urinary incontinence: Secondary | ICD-10-CM | POA: Diagnosis not present

## 2023-02-14 DIAGNOSIS — E781 Pure hyperglyceridemia: Secondary | ICD-10-CM | POA: Diagnosis not present

## 2023-02-14 DIAGNOSIS — R7401 Elevation of levels of liver transaminase levels: Secondary | ICD-10-CM | POA: Diagnosis not present

## 2023-02-14 DIAGNOSIS — F411 Generalized anxiety disorder: Secondary | ICD-10-CM | POA: Diagnosis not present

## 2023-02-14 DIAGNOSIS — F4312 Post-traumatic stress disorder, chronic: Secondary | ICD-10-CM | POA: Diagnosis not present

## 2023-02-14 DIAGNOSIS — Z8709 Personal history of other diseases of the respiratory system: Secondary | ICD-10-CM | POA: Diagnosis not present

## 2023-02-14 DIAGNOSIS — K219 Gastro-esophageal reflux disease without esophagitis: Secondary | ICD-10-CM | POA: Diagnosis not present

## 2023-02-14 DIAGNOSIS — Z0000 Encounter for general adult medical examination without abnormal findings: Secondary | ICD-10-CM | POA: Diagnosis not present

## 2023-02-14 DIAGNOSIS — Z23 Encounter for immunization: Secondary | ICD-10-CM | POA: Diagnosis not present

## 2023-02-14 DIAGNOSIS — Z79899 Other long term (current) drug therapy: Secondary | ICD-10-CM | POA: Diagnosis not present

## 2023-02-14 DIAGNOSIS — F329 Major depressive disorder, single episode, unspecified: Secondary | ICD-10-CM | POA: Diagnosis not present

## 2023-02-21 DIAGNOSIS — Z1231 Encounter for screening mammogram for malignant neoplasm of breast: Secondary | ICD-10-CM | POA: Diagnosis not present

## 2023-03-09 DIAGNOSIS — M79672 Pain in left foot: Secondary | ICD-10-CM | POA: Diagnosis not present

## 2023-06-11 DIAGNOSIS — R152 Fecal urgency: Secondary | ICD-10-CM | POA: Diagnosis not present

## 2023-06-11 DIAGNOSIS — R194 Change in bowel habit: Secondary | ICD-10-CM | POA: Diagnosis not present

## 2023-06-11 DIAGNOSIS — R197 Diarrhea, unspecified: Secondary | ICD-10-CM | POA: Diagnosis not present

## 2023-07-09 DIAGNOSIS — R197 Diarrhea, unspecified: Secondary | ICD-10-CM | POA: Diagnosis not present

## 2023-09-10 DIAGNOSIS — J45901 Unspecified asthma with (acute) exacerbation: Secondary | ICD-10-CM | POA: Diagnosis not present

## 2023-12-12 DIAGNOSIS — M79672 Pain in left foot: Secondary | ICD-10-CM | POA: Diagnosis not present

## 2024-01-02 ENCOUNTER — Encounter: Payer: Self-pay | Admitting: Psychiatry

## 2024-01-02 ENCOUNTER — Ambulatory Visit (INDEPENDENT_AMBULATORY_CARE_PROVIDER_SITE_OTHER): Payer: 59 | Admitting: Psychiatry

## 2024-01-02 DIAGNOSIS — F4310 Post-traumatic stress disorder, unspecified: Secondary | ICD-10-CM

## 2024-01-02 DIAGNOSIS — F5105 Insomnia due to other mental disorder: Secondary | ICD-10-CM | POA: Diagnosis not present

## 2024-01-02 DIAGNOSIS — F3181 Bipolar II disorder: Secondary | ICD-10-CM

## 2024-01-02 MED ORDER — ARIPIPRAZOLE 15 MG PO TABS: 15.0000 mg | ORAL_TABLET | Freq: Every day | ORAL | 3 refills | Status: AC

## 2024-01-02 MED ORDER — BUPROPION HCL ER (SR) 100 MG PO TB12: 200.0000 mg | ORAL_TABLET | Freq: Every morning | ORAL | 3 refills | Status: AC

## 2024-01-02 MED ORDER — MIRTAZAPINE 15 MG PO TABS: 15.0000 mg | ORAL_TABLET | Freq: Every day | ORAL | 3 refills | Status: DC

## 2024-01-02 NOTE — Addendum Note (Signed)
 Addended by: COTTLE, Shady Padron G on: 01/02/2024 09:24 AM   Modules accepted: Orders
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
## 2024-01-09 DIAGNOSIS — M79672 Pain in left foot: Secondary | ICD-10-CM | POA: Diagnosis not present

## 2024-01-25 ENCOUNTER — Other Ambulatory Visit: Payer: Self-pay | Admitting: Psychiatry

## 2024-01-25 DIAGNOSIS — F5105 Insomnia due to other mental disorder: Secondary | ICD-10-CM

## 2024-01-25 DIAGNOSIS — F4310 Post-traumatic stress disorder, unspecified: Secondary | ICD-10-CM

## 2024-02-01 DIAGNOSIS — M79672 Pain in left foot: Secondary | ICD-10-CM | POA: Diagnosis not present

## 2024-02-11 DIAGNOSIS — R7401 Elevation of levels of liver transaminase levels: Secondary | ICD-10-CM | POA: Diagnosis not present

## 2024-02-11 DIAGNOSIS — Z79899 Other long term (current) drug therapy: Secondary | ICD-10-CM | POA: Diagnosis not present

## 2024-02-11 DIAGNOSIS — R946 Abnormal results of thyroid function studies: Secondary | ICD-10-CM | POA: Diagnosis not present

## 2024-02-11 DIAGNOSIS — R7309 Other abnormal glucose: Secondary | ICD-10-CM | POA: Diagnosis not present

## 2024-02-11 DIAGNOSIS — E781 Pure hyperglyceridemia: Secondary | ICD-10-CM | POA: Diagnosis not present

## 2024-02-22 DIAGNOSIS — Z1231 Encounter for screening mammogram for malignant neoplasm of breast: Secondary | ICD-10-CM | POA: Diagnosis not present

## 2024-02-25 DIAGNOSIS — F411 Generalized anxiety disorder: Secondary | ICD-10-CM | POA: Diagnosis not present

## 2024-02-25 DIAGNOSIS — Z0000 Encounter for general adult medical examination without abnormal findings: Secondary | ICD-10-CM | POA: Diagnosis not present

## 2024-02-25 DIAGNOSIS — I10 Essential (primary) hypertension: Secondary | ICD-10-CM | POA: Diagnosis not present

## 2024-02-25 DIAGNOSIS — K915 Postcholecystectomy syndrome: Secondary | ICD-10-CM | POA: Diagnosis not present

## 2024-02-25 DIAGNOSIS — R32 Unspecified urinary incontinence: Secondary | ICD-10-CM | POA: Diagnosis not present

## 2024-02-25 DIAGNOSIS — R946 Abnormal results of thyroid function studies: Secondary | ICD-10-CM | POA: Diagnosis not present

## 2024-02-25 DIAGNOSIS — F3181 Bipolar II disorder: Secondary | ICD-10-CM | POA: Diagnosis not present

## 2024-02-25 DIAGNOSIS — L719 Rosacea, unspecified: Secondary | ICD-10-CM | POA: Diagnosis not present

## 2024-02-25 DIAGNOSIS — F4312 Post-traumatic stress disorder, chronic: Secondary | ICD-10-CM | POA: Diagnosis not present

## 2024-02-25 DIAGNOSIS — R7309 Other abnormal glucose: Secondary | ICD-10-CM | POA: Diagnosis not present

## 2024-02-25 DIAGNOSIS — Z23 Encounter for immunization: Secondary | ICD-10-CM | POA: Diagnosis not present

## 2024-02-25 DIAGNOSIS — H6980 Other specified disorders of Eustachian tube, unspecified ear: Secondary | ICD-10-CM | POA: Diagnosis not present

## 2024-02-27 DIAGNOSIS — M84375D Stress fracture, left foot, subsequent encounter for fracture with routine healing: Secondary | ICD-10-CM | POA: Diagnosis not present

## 2024-02-29 DIAGNOSIS — R928 Other abnormal and inconclusive findings on diagnostic imaging of breast: Secondary | ICD-10-CM | POA: Diagnosis not present

## 2024-06-12 ENCOUNTER — Ambulatory Visit (INDEPENDENT_AMBULATORY_CARE_PROVIDER_SITE_OTHER): Admitting: Physician Assistant

## 2024-06-12 ENCOUNTER — Encounter (INDEPENDENT_AMBULATORY_CARE_PROVIDER_SITE_OTHER): Payer: Self-pay | Admitting: Physician Assistant

## 2024-06-12 VITALS — BP 146/82 | HR 109 | Temp 97.8°F | Ht 60.0 in | Wt 205.0 lb

## 2024-06-12 DIAGNOSIS — H9313 Tinnitus, bilateral: Secondary | ICD-10-CM

## 2024-06-12 NOTE — Progress Notes (Signed)
 " Dear Dr. Gerome, Here is my assessment for our mutual patient, Victoria Camacho. Thank you for allowing me the opportunity to care for your patient. Please do not hesitate to contact me should you have any other questions. Sincerely, Chyrl Cohen PA-C  Otolaryngology Clinic Note Referring provider: Dr. Gerome HPI:  Victoria Camacho is a 54 y.o. female kindly referred by Dr. Gerome   Discussed the use of AI scribe software for clinical note transcription with the patient, who gave verbal consent to proceed.  History of Present Illness   Victoria Camacho is a 54 year old female with hypertension who presents with persistent bilateral tinnitus.  She developed a high-pitched ringing in both ears on February 09, 2024, following an episode in late September or early October characterized by anosmia and ageusia, possibly related to a viral illness such as COVID-19. The tinnitus is constant, present daily, and she reports it is worse in quiet environments. She also experiences intermittent clicking in the left ear. Background noise, such as a washing machine or dryer, helps mask the tinnitus.  She denies otalgia, history of otitis media, or otologic trauma. She perceives her hearing as intact and denies hearing loss, numbness, paresthesia, weakness, or other neurological symptoms. No new medications were initiated around the onset of symptoms; doxycycline was started at the end of October 2025, after the onset of tinnitus. She has never received chemotherapy.           Independent Review of Additional Tests or Records:  none   PMH/Meds/All/SocHx/FamHx/ROS:   Past Medical History:  Diagnosis Date   Anxiety    Arthritis    Asthma    Bladder pain    Bulging lumbar disc    Depression    Endometriosis    GERD (gastroesophageal reflux disease)    History of DVT of lower extremity    2011   Hypertension    Interstitial cystitis    Wears contact lenses      Past Surgical History:   Procedure Laterality Date   CARPAL TUNNEL RELEASE Bilateral left 2000/   right 1993   CYSTO WITH HYDRODISTENSION N/A 01/21/2014   Procedure: CYSTO WITH HYDRODISTENSION, AND INSTILLATION OF MARCAINE  AND PYRIDIUM ;  Surgeon: Morene LELON Salines, MD;  Location: [facility];  Service: Urology;  Laterality: N/A;   CYSTO/  HYDRODISTENTION/ INSTILLATION THERAPY  01-11-2010/   1995   DX LAPAROSCOPY /  BIOPSY AND FULGERATION ENDOMETRIOSIS  12/20/2009   LAPAROSCOPIC CHOLECYSTECTOMY  05/08/1998   LUMBAR EPIDURAL INJECTION  2012  (w/ MAC)   REPLACEMENT TOTAL KNEE Right 09/2020   TUBAL LIGATION  06/08/1998   VAGINAL HYSTERECTOMY  04/18/2010    Family History  Problem Relation Age of Onset   Diabetes Mother    Rheum arthritis Mother    Stroke Mother    Heart disease Father      Social Connections: Not on file     Current Medications[1]   Physical Exam:   BP (!) 146/82 Comment: 175/91 1st attempt  Pulse (!) 109   Temp 97.8 F (36.6 C)   Ht 5' (1.524 m)   Wt 205 lb (93 kg)   SpO2 95%   BMI 40.04 kg/m   Pertinent Findings  CN II-XII grossly intact Bilateral EAC clear and TM intact with well pneumatized middle ear spaces Anterior rhinoscopy: Septum midline; bilateral inferior turbinates with no hypertrophy No lesions of oral cavity/oropharynx; dentition WNL No obviously palpable neck masses/lymphadenopathy/thyromegaly No respiratory distress or stridor  Seprately Identifiable Procedures:  None  Impression & Plans:  [name] is a 54 y.o. female with the following   Assessment and Plan    Bilateral tinnitus Persistent bilateral tinnitus likely post-viral, possibly related to COVID-19. No hearing loss, otalgia, neurologic symptoms, or trauma. Bilateral presentation and absence of red flags suggest benign process. Chronicity less than one year increases likelihood of resolution. Differential includes idiopathic tinnitus. - Ordered audiogram to assess  hearing loss and symmetry. - Provided education on benign nature of bilateral tinnitus without concerning features. - Discussed use of background noise to mask tinnitus, especially at night. - Instructed to await follow-up on audiogram results; direct contact for concerning findings, otherwise normal results relayed by office staff.           - f/u phone office visit with audiological results, if results not concerning office staff will reach out to her   Thank you for allowing me the opportunity to care for your patient. Please do not hesitate to contact me should you have any other questions.  Sincerely, Chyrl Cohen PA-C [facility] Phone: [phone] Fax: [phone]  06/12/2024, 3:37 PM        [1]  Current Outpatient Medications:    albuterol  (PROAIR  HFA) 108 (90 Base) MCG/ACT inhaler, Inhale 2 puffs into the lungs every 6 (six) hours as needed for wheezing or shortness of breath (wheezing)., Disp: , Rfl:    ARIPiprazole  (ABILIFY ) 15 MG tablet, Take 1 tablet (15 mg total) by mouth daily., Disp: 90 tablet, Rfl: 3   buPROPion  ER (WELLBUTRIN  SR) 100 MG 12 hr tablet, Take 2 tablets (200 mg total) by mouth every morning., Disp: 180 tablet, Rfl: 3   colestipol (COLESTID) 1 g tablet, Take 2 g by mouth 2 (two) times daily., Disp: , Rfl:    famotidine (PEPCID) 20 MG tablet, Take 20 mg by mouth 2 (two) times daily., Disp: , Rfl:    metoprolol  succinate (TOPROL -XL) 25 MG 24 hr tablet, Take 1 tablet (25 mg total) by mouth daily. Take with or immediately following a meal., Disp: 90 tablet, Rfl: 3   mirtazapine  (REMERON ) 15 MG tablet, TAKE 1 TABLET BY MOUTH EVERYDAY AT BEDTIME, Disp: 30 tablet, Rfl: 11  "

## 2024-06-18 ENCOUNTER — Ambulatory Visit (INDEPENDENT_AMBULATORY_CARE_PROVIDER_SITE_OTHER): Admitting: Audiology

## 2025-01-01 ENCOUNTER — Ambulatory Visit: Admitting: Psychiatry
